# Patient Record
Sex: Male | Born: 1965 | Race: White | Hispanic: No | Marital: Married | Smoking: Never smoker
Health system: Southern US, Community
[De-identification: ages and names within clinical notes are randomized; demographics above are authoritative.]

## PROBLEM LIST (undated history)

## (undated) DIAGNOSIS — R55 Syncope and collapse: Secondary | ICD-10-CM

## (undated) DIAGNOSIS — I480 Paroxysmal atrial fibrillation: Secondary | ICD-10-CM

## (undated) DIAGNOSIS — E785 Hyperlipidemia, unspecified: Secondary | ICD-10-CM

## (undated) DIAGNOSIS — G4730 Sleep apnea, unspecified: Secondary | ICD-10-CM

## (undated) HISTORY — PX: APPENDECTOMY: SHX54

---

## 2009-02-27 ENCOUNTER — Ambulatory Visit: Payer: Self-pay | Admitting: Unknown Physician Specialty

## 2015-07-16 ENCOUNTER — Encounter: Payer: Self-pay | Admitting: *Deleted

## 2015-07-16 DIAGNOSIS — Z79899 Other long term (current) drug therapy: Secondary | ICD-10-CM | POA: Diagnosis not present

## 2015-07-16 DIAGNOSIS — Z8371 Family history of colonic polyps: Secondary | ICD-10-CM | POA: Diagnosis not present

## 2015-07-16 DIAGNOSIS — G4730 Sleep apnea, unspecified: Secondary | ICD-10-CM | POA: Diagnosis not present

## 2015-07-16 DIAGNOSIS — Z7982 Long term (current) use of aspirin: Secondary | ICD-10-CM | POA: Diagnosis not present

## 2015-07-16 DIAGNOSIS — D123 Benign neoplasm of transverse colon: Secondary | ICD-10-CM | POA: Diagnosis not present

## 2015-07-16 DIAGNOSIS — K640 First degree hemorrhoids: Secondary | ICD-10-CM | POA: Diagnosis not present

## 2015-07-16 DIAGNOSIS — Z1211 Encounter for screening for malignant neoplasm of colon: Secondary | ICD-10-CM | POA: Diagnosis present

## 2015-07-16 NOTE — Anesthesia Preprocedure Evaluation (Addendum)
Anesthesia Evaluation  Patient identified by MRN, date of birth, ID band Patient awake    Reviewed: Allergy & Precautions, H&P , NPO status , Patient's Chart, lab work & pertinent test results  History of Anesthesia Complications (+) PONV  Airway Mallampati: III  TM Distance: >3 FB Neck ROM: limited    Dental no notable dental hx. (+) Teeth Intact   Pulmonary sleep apnea and Continuous Positive Airway Pressure Ventilation ,  breath sounds clear to auscultation  Pulmonary exam normal       Cardiovascular negative cardio ROS Normal cardiovascular examRhythm:regular Rate:Normal     Neuro/Psych negative neurological ROS  negative psych ROS   GI/Hepatic negative GI ROS, Neg liver ROS,   Endo/Other  negative endocrine ROS  Renal/GU negative Renal ROS  negative genitourinary   Musculoskeletal   Abdominal Normal abdominal exam  (+)   Peds  Hematology negative hematology ROS (+)   Anesthesia Other Findings Past Medical History:   Sleep apnea                                                  Reproductive/Obstetrics negative OB ROS                           Anesthesia Physical Anesthesia Plan  ASA: III  Anesthesia Plan: General   Post-op Pain Management:    Induction: Intravenous  Airway Management Planned:   Additional Equipment:   Intra-op Plan:   Post-operative Plan:   Informed Consent: I have reviewed the patients History and Physical, chart, labs and discussed the procedure including the risks, benefits and alternatives for the proposed anesthesia with the patient or authorized representative who has indicated his/her understanding and acceptance.   Dental Advisory Given  Plan Discussed with: Anesthesiologist, CRNA and Surgeon  Anesthesia Plan Comments:        Anesthesia Quick Evaluation

## 2015-07-17 ENCOUNTER — Encounter: Payer: Self-pay | Admitting: Anesthesiology

## 2015-07-17 ENCOUNTER — Ambulatory Visit: Payer: BLUE CROSS/BLUE SHIELD | Admitting: Anesthesiology

## 2015-07-17 ENCOUNTER — Encounter
Admission: RE | Disposition: A | Discharge status: Home / Self Care | Payer: Self-pay | Source: Ambulatory Visit | Attending: Gastroenterology

## 2015-07-17 ENCOUNTER — Ambulatory Visit
Admission: RE | Admit: 2015-07-17 | Discharge: 2015-07-17 | Disposition: A | Discharge status: Home / Self Care | Payer: BLUE CROSS/BLUE SHIELD | Source: Ambulatory Visit | Attending: Gastroenterology | Admitting: Gastroenterology

## 2015-07-17 DIAGNOSIS — K640 First degree hemorrhoids: Secondary | ICD-10-CM | POA: Insufficient documentation

## 2015-07-17 DIAGNOSIS — Z8371 Family history of colonic polyps: Secondary | ICD-10-CM | POA: Insufficient documentation

## 2015-07-17 DIAGNOSIS — D123 Benign neoplasm of transverse colon: Secondary | ICD-10-CM | POA: Insufficient documentation

## 2015-07-17 DIAGNOSIS — Z7982 Long term (current) use of aspirin: Secondary | ICD-10-CM | POA: Insufficient documentation

## 2015-07-17 DIAGNOSIS — G4730 Sleep apnea, unspecified: Secondary | ICD-10-CM | POA: Insufficient documentation

## 2015-07-17 DIAGNOSIS — Z1211 Encounter for screening for malignant neoplasm of colon: Secondary | ICD-10-CM | POA: Insufficient documentation

## 2015-07-17 DIAGNOSIS — Z79899 Other long term (current) drug therapy: Secondary | ICD-10-CM | POA: Insufficient documentation

## 2015-07-17 HISTORY — DX: Sleep apnea, unspecified: G47.30

## 2015-07-17 HISTORY — PX: COLONOSCOPY WITH PROPOFOL: SHX5780

## 2015-07-17 SURGERY — COLONOSCOPY WITH PROPOFOL
Anesthesia: General

## 2015-07-17 MED ORDER — SODIUM CHLORIDE 0.9 % IV SOLN: INTRAVENOUS | Status: DC

## 2015-07-17 MED ORDER — PROPOFOL INFUSION 10 MG/ML OPTIME
INTRAVENOUS | Status: DC | PRN
  Administered 2015-07-17: 140 ug/kg/min via INTRAVENOUS

## 2015-07-17 MED ORDER — FENTANYL CITRATE (PF) 100 MCG/2ML IJ SOLN
INTRAMUSCULAR | Status: DC | PRN
  Administered 2015-07-17: 50 ug via INTRAVENOUS

## 2015-07-17 MED ORDER — MIDAZOLAM HCL 2 MG/2ML IJ SOLN
INTRAMUSCULAR | Status: DC | PRN
  Administered 2015-07-17: 1 mg via INTRAVENOUS

## 2015-07-17 MED ORDER — SODIUM CHLORIDE 0.9 % IV SOLN
INTRAVENOUS | Status: DC
  Administered 2015-07-17: 1000 mL via INTRAVENOUS

## 2015-07-17 NOTE — Anesthesia Postprocedure Evaluation (Signed)
  Anesthesia Post-op Note  Patient: Jesse Santiago  Procedure(s) Performed: Procedure(s): COLONOSCOPY WITH PROPOFOL (N/A)  Anesthesia type:General  Patient location: PACU  Post pain: Pain level controlled  Post assessment: Post-op Vital signs reviewed, Patient's Cardiovascular Status Stable, Respiratory Function Stable, Patent Airway and No signs of Nausea or vomiting  Post vital signs: Reviewed and stable  Last Vitals:  Filed Vitals:   07/17/15 1100  BP: 87/50  Pulse: 71  Temp:   Resp: 20    Level of consciousness: awake, alert  and patient cooperative  Complications: No apparent anesthesia complications

## 2015-07-17 NOTE — Op Note (Signed)
[facility] Patient Name: Jesse Santiago Procedure Date: 07/17/2015 10:06 AM MRN: [id number] Account #: [id number] Date of Birth: [birth year] Admit Type: Outpatient Age: 49 Room: [facility] ENDO ROOM 3 Gender: Male Note Status: Finalized Procedure:         Colonoscopy Indications:       Colon cancer screening in patient at increased risk:                     Family history of colon polyps, This is the patient's                     first colonoscopy Patient Profile:   This is a 49 year old male. Providers:         Gerrit Heck. Rayann Heman, MD Referring MD:      Eustace Quail. Hasmann (Referring MD) Medicines:         Propofol per Anesthesia Complications:     No immediate complications. Procedure:         Pre-Anesthesia Assessment:                    - Prior to the procedure, a History and Physical was                     performed, and patient medications, allergies and                     sensitivities were reviewed. The patient's tolerance of                     previous anesthesia was reviewed.                    After obtaining informed consent, the colonoscope was                     passed under direct vision. Throughout the procedure, the                     patient's blood pressure, pulse, and oxygen saturations                     were monitored continuously. The Colonoscope was                     introduced through the anus and advanced to the the cecum,                     identified by appendiceal orifice and ileocecal valve. The                     colonoscopy was performed without difficulty. The patient                     tolerated the procedure well. The quality of the bowel                     preparation was excellent. Findings:      The perianal and digital rectal examinations were normal.      A 3 mm polyp was found in the transverse colon. The polyp was sessile.       The polyp was removed with a cold snare. Resection and retrieval were   complete.      Internal hemorrhoids were found during retroflexion. The hemorrhoids  were Grade I (internal hemorrhoids that do not prolapse).      The exam was otherwise without abnormality. Impression:        - One 3 mm polyp in the transverse colon. Resected and                     retrieved.                    - Internal hemorrhoids.                    - The examination was otherwise normal. Recommendation:    - Observe patient in GI recovery unit.                    - Continue present medications.                    - Await pathology results.                    - Repeat colonoscopy in 5 years for surveillance /                     screening based on pathology results.                    - Return to referring physician.                    - The findings and recommendations were discussed with the                     patient.                    - The findings and recommendations were discussed with the                     patient's family. Procedure Code(s): --- Professional ---                    [id number], Colonoscopy, flexible; with removal of tumor(s),                     polyp(s), or other lesion(s) by snare technique CPT copyright 2014 [employer]. All rights reserved. The codes documented in this report are preliminary and upon coder review may  be revised to meet current compliance requirements. Mellody Life, MD 07/17/2015 10:51:11 AM This report has been signed electronically. Number of Addenda: 0 Note Initiated On: 07/17/2015 10:06 AM Scope Withdrawal Time: 0 hours 11 minutes 21 seconds  Total Procedure Duration: 0 hours 14 minutes 32 seconds       [facility]

## 2015-07-17 NOTE — H&P (Signed)
  Primary Care Physician:  Default, Provider, MD  Pre-Procedure History & Physical: HPI:  Jesse Santiago is a 49 y.o. male is here for an colonoscopy.   Past Medical History  Diagnosis Date  . Sleep apnea     Past Surgical History  Procedure Laterality Date  . Appendectomy      Prior to Admission medications   Medication Sig Start Date End Date Taking? Authorizing Provider  aspirin 81 MG tablet Take 81 mg by mouth daily.   Yes Historical Provider, MD  fluticasone (FLONASE) 50 MCG/ACT nasal spray Place into both nostrils daily.   Yes Historical Provider, MD  Multiple Vitamin (MULTIVITAMIN WITH MINERALS) TABS tablet Take 1 tablet by mouth daily.   Yes Historical Provider, MD  OMEGA-3 FATTY ACIDS PO Take by mouth.   Yes Historical Provider, MD    Allergies as of 06/30/2015  . (Not on File)    History reviewed. No pertinent family history.  Social History   Social History  . Marital Status: Married    Spouse Name: N/A  . Number of Children: N/A  . Years of Education: N/A   Occupational History  . Not on file.   Social History Main Topics  . Smoking status: Not on file  . Smokeless tobacco: Not on file  . Alcohol Use: Not on file  . Drug Use: Not on file  . Sexual Activity: Not on file   Other Topics Concern  . Not on file   Social History Narrative     Physical Exam: BP 124/81 mmHg  Pulse 79  Temp(Src) 97.9 F (36.6 C) (Tympanic)  Resp 20  Ht 5\' 10"  (1.778 m)  Wt 96.163 kg (212 lb)  BMI 30.42 kg/m2  SpO2 97% General:   Alert,  pleasant and cooperative in NAD Head:  Normocephalic and atraumatic. Neck:  Supple; no masses or thyromegaly. Lungs:  Clear throughout to auscultation.    Heart:  Regular rate and rhythm. Abdomen:  Soft, nontender and nondistended. Normal bowel sounds, without guarding, and without rebound.   Neurologic:  Alert and  oriented x4;  grossly normal neurologically.  Impression/Plan: Jesse Santiago is here for an colonoscopy to be  performed for screening  Risks, benefits, limitations, and alternatives regarding  colonoscopy have been reviewed with the patient.  Questions have been answered.  All parties agreeable.   Josefine Class, MD  07/17/2015, 10:22 AM

## 2015-07-17 NOTE — Discharge Instructions (Signed)

## 2015-07-17 NOTE — Transfer of Care (Signed)
Immediate Anesthesia Transfer of Care Note  Patient: Jesse Santiago  Procedure(s) Performed: Procedure(s): COLONOSCOPY WITH PROPOFOL (N/A)  Patient Location: PACU and Endoscopy Unit  Anesthesia Type:General  Level of Consciousness: alert   Airway & Oxygen Therapy: Patient Spontanous Breathing  Post-op Assessment: Report given to RN  Post vital signs: Reviewed  Last Vitals:  Filed Vitals:   07/17/15 1100  BP: 87/50  Pulse: 71  Temp:   Resp: 20    Complications: No apparent anesthesia complications

## 2015-07-18 NOTE — Progress Notes (Signed)
Non-identifying voicemail.  No message left.

## 2015-07-20 ENCOUNTER — Encounter: Payer: Self-pay | Admitting: Gastroenterology

## 2015-07-20 LAB — SURGICAL PATHOLOGY

## 2016-05-02 DIAGNOSIS — E785 Hyperlipidemia, unspecified: Secondary | ICD-10-CM | POA: Insufficient documentation

## 2016-05-02 DIAGNOSIS — G4733 Obstructive sleep apnea (adult) (pediatric): Secondary | ICD-10-CM | POA: Insufficient documentation

## 2018-01-28 DIAGNOSIS — I4891 Unspecified atrial fibrillation: Secondary | ICD-10-CM | POA: Insufficient documentation

## 2018-12-27 ENCOUNTER — Emergency Department: Payer: BLUE CROSS/BLUE SHIELD

## 2018-12-27 ENCOUNTER — Other Ambulatory Visit: Payer: Self-pay

## 2018-12-27 ENCOUNTER — Observation Stay
Admission: EM | Admit: 2018-12-27 | Discharge: 2018-12-28 | Disposition: A | Discharge status: Home / Self Care | Payer: BLUE CROSS/BLUE SHIELD | Attending: Internal Medicine | Admitting: Internal Medicine

## 2018-12-27 DIAGNOSIS — I480 Paroxysmal atrial fibrillation: Secondary | ICD-10-CM | POA: Diagnosis not present

## 2018-12-27 DIAGNOSIS — E669 Obesity, unspecified: Secondary | ICD-10-CM | POA: Insufficient documentation

## 2018-12-27 DIAGNOSIS — G4733 Obstructive sleep apnea (adult) (pediatric): Secondary | ICD-10-CM | POA: Insufficient documentation

## 2018-12-27 DIAGNOSIS — R42 Dizziness and giddiness: Secondary | ICD-10-CM | POA: Diagnosis present

## 2018-12-27 DIAGNOSIS — R55 Syncope and collapse: Secondary | ICD-10-CM | POA: Diagnosis present

## 2018-12-27 DIAGNOSIS — I4891 Unspecified atrial fibrillation: Secondary | ICD-10-CM | POA: Diagnosis present

## 2018-12-27 DIAGNOSIS — E785 Hyperlipidemia, unspecified: Secondary | ICD-10-CM | POA: Diagnosis not present

## 2018-12-27 DIAGNOSIS — Z7982 Long term (current) use of aspirin: Secondary | ICD-10-CM | POA: Insufficient documentation

## 2018-12-27 DIAGNOSIS — G4730 Sleep apnea, unspecified: Secondary | ICD-10-CM | POA: Diagnosis present

## 2018-12-27 HISTORY — DX: Syncope and collapse: R55

## 2018-12-27 HISTORY — DX: Hyperlipidemia, unspecified: E78.5

## 2018-12-27 HISTORY — DX: Paroxysmal atrial fibrillation: I48.0

## 2018-12-27 NOTE — ED Triage Notes (Signed)
Pt arrived via [facility] from home with c/o near syncope, lightheaded, and dizzy. EMS states that last year pt had a similar episode and after time was taken off his metoprolol due to no findings of afib. EMS states that pt was in afib at arrival but normal on arrival.

## 2018-12-28 ENCOUNTER — Encounter: Payer: Self-pay | Admitting: Hospitalist

## 2018-12-28 ENCOUNTER — Emergency Department: Payer: BLUE CROSS/BLUE SHIELD

## 2018-12-28 ENCOUNTER — Observation Stay: Payer: BLUE CROSS/BLUE SHIELD

## 2018-12-28 ENCOUNTER — Other Ambulatory Visit: Payer: Self-pay

## 2018-12-28 ENCOUNTER — Observation Stay
Admit: 2018-12-28 | Discharge: 2018-12-28 | Disposition: A | Discharge status: Home / Self Care | Payer: BLUE CROSS/BLUE SHIELD | Attending: Family Medicine | Admitting: Family Medicine

## 2018-12-28 DIAGNOSIS — R55 Syncope and collapse: Secondary | ICD-10-CM | POA: Diagnosis present

## 2018-12-28 DIAGNOSIS — G4730 Sleep apnea, unspecified: Secondary | ICD-10-CM | POA: Diagnosis present

## 2018-12-28 DIAGNOSIS — R42 Dizziness and giddiness: Secondary | ICD-10-CM

## 2018-12-28 DIAGNOSIS — I480 Paroxysmal atrial fibrillation: Secondary | ICD-10-CM | POA: Diagnosis present

## 2018-12-28 LAB — BASIC METABOLIC PANEL
Anion gap: 6 (ref 5–15)
BUN: 20 mg/dL (ref 6–20)
CHLORIDE: 104 mmol/L (ref 98–111)
CO2: 30 mmol/L (ref 22–32)
Calcium: 9.3 mg/dL (ref 8.9–10.3)
Creatinine, Ser: 1.04 mg/dL (ref 0.61–1.24)
GFR calc non Af Amer: >60 mL/min (ref >60)
Glucose, Bld: 124 mg/dL — ABNORMAL HIGH (ref 70–99)
POTASSIUM: 3.6 mmol/L (ref 3.5–5.1)
SODIUM: 140 mmol/L (ref 135–145)

## 2018-12-28 LAB — LIPID PANEL
Cholesterol: 110 mg/dL (ref 0–200)
HDL: 38 mg/dL — ABNORMAL LOW (ref >40)
LDL Cholesterol: 49 mg/dL (ref 0–99)
Total CHOL/HDL Ratio: 2.9 RATIO
Triglycerides: 113 mg/dL (ref <150)
VLDL: 23 mg/dL (ref 0–40)

## 2018-12-28 LAB — TROPONIN I
Troponin I: <0.03 ng/mL (ref <0.03)
Troponin I: <0.03 ng/mL (ref <0.03)

## 2018-12-28 LAB — ECHOCARDIOGRAM COMPLETE
Height: 70 in
Weight: 3414.4 oz

## 2018-12-28 LAB — CBC
HCT: 43.9 % (ref 39.0–52.0)
HEMOGLOBIN: 15 g/dL (ref 13.0–17.0)
MCH: 32.3 pg (ref 26.0–34.0)
MCHC: 34.2 g/dL (ref 30.0–36.0)
MCV: 94.4 fL (ref 80.0–100.0)
NRBC: 0 % (ref 0.0–0.2)
PLATELETS: 224 10*3/uL (ref 150–400)
RBC: 4.65 MIL/uL (ref 4.22–5.81)
RDW: 12.7 % (ref 11.5–15.5)
WBC: 9 10*3/uL (ref 4.0–10.5)

## 2018-12-28 LAB — TSH: TSH: 3.554 u[IU]/mL (ref 0.350–4.500)

## 2018-12-28 MED ORDER — GADOBUTROL 1 MMOL/ML IV SOLN
9.0000 mL | Freq: Once | INTRAVENOUS | Status: AC | PRN
  Administered 2018-12-28: 9 mL via INTRAVENOUS

## 2018-12-28 MED ORDER — ONDANSETRON HCL 4 MG/2ML IJ SOLN: 4.0000 mg | Freq: Four times a day (QID) | INTRAMUSCULAR | Status: DC | PRN

## 2018-12-28 MED ORDER — ACETAMINOPHEN 650 MG RE SUPP: 650.0000 mg | Freq: Four times a day (QID) | RECTAL | Status: DC | PRN

## 2018-12-28 MED ORDER — HYDROCODONE-ACETAMINOPHEN 5-325 MG PO TABS: 1.0000 | ORAL_TABLET | ORAL | Status: DC | PRN

## 2018-12-28 MED ORDER — FLUTICASONE PROPIONATE 50 MCG/ACT NA SUSP
1.0000 | NASAL | Status: DC | PRN
  Filled 2018-12-28: qty 16

## 2018-12-28 MED ORDER — ACETAMINOPHEN 325 MG PO TABS: 650.0000 mg | ORAL_TABLET | Freq: Four times a day (QID) | ORAL | Status: DC | PRN

## 2018-12-28 MED ORDER — ENOXAPARIN SODIUM 40 MG/0.4ML ~~LOC~~ SOLN
40.0000 mg | SUBCUTANEOUS | Status: DC
  Administered 2018-12-28: 40 mg via SUBCUTANEOUS
  Filled 2018-12-28: qty 0.4

## 2018-12-28 MED ORDER — POLYETHYLENE GLYCOL 3350 17 G PO PACK: 17.0000 g | PACK | Freq: Every day | ORAL | Status: DC | PRN

## 2018-12-28 MED ORDER — SODIUM CHLORIDE 0.9% FLUSH
3.0000 mL | Freq: Two times a day (BID) | INTRAVENOUS | Status: DC
  Administered 2018-12-28: 3 mL via INTRAVENOUS

## 2018-12-28 MED ORDER — ASPIRIN EC 81 MG PO TBEC: 81.0000 mg | DELAYED_RELEASE_TABLET | Freq: Every day | ORAL | Status: DC

## 2018-12-28 MED ORDER — ONDANSETRON HCL 4 MG PO TABS: 4.0000 mg | ORAL_TABLET | Freq: Four times a day (QID) | ORAL | Status: DC | PRN

## 2018-12-28 NOTE — Progress Notes (Signed)
*  PRELIMINARY RESULTS* Echocardiogram 2D Echocardiogram has been performed.  Jesse Santiago 12/28/2018, 11:27 AM

## 2018-12-28 NOTE — ED Notes (Signed)
Patient transported to CT 

## 2018-12-28 NOTE — ED Notes (Signed)
Patient transported to MRI 

## 2018-12-28 NOTE — Progress Notes (Signed)
Family Meeting Note  Advance Directive:yes  Today a meeting took place with the Patient, spouse.  Patient is able to participate  The following clinical team members were present during this meeting:MD  The following were discussed:Patient's diagnosis: Near syncope, obstructive sleep apnea, paroxysmal A. fib, hyperlipidemia, Patient's progosis: Unable to determine and Goals for treatment: Full Code  Additional follow-up to be provided: prn  Time spent during discussion:20 minutes  Gorden Harms, MD

## 2018-12-28 NOTE — H&P (Signed)
[facility] [place] NAME: Jesse Santiago    MR#:  [id number]  DATE OF BIRTH:  1966-06-15  DATE OF ADMISSION:  12/27/2018  PRIMARY CARE PHYSICIAN: Katheren Shams   REQUESTING/REFERRING PHYSICIAN:   CHIEF COMPLAINT:   Chief Complaint  Patient presents with  . Near Syncope    HISTORY OF PRESENT ILLNESS: Jesse Santiago  is a 53 y.o. male with a known history per below presenting with acute dizziness, lightheadedness, near syncope after episode of eruptations earlier today, similar episode last year which required short stay at [facility] in which stress test was normal/echocardiogram normal, patient denies feeling ill, no chest pain/shortness of breath,+ sweating per wife, in the emergency room patient underwent extensive work-up that included CT head/MRI/MRA of the brain which was unimpressive, laboratory investigations unimpressive, EKG noted for rate controlled A. fib, patient evaluated in the emergency room, no apparent distress, resting comfortably in bed, wife at the bedside, patient is now being admitted for acute dizziness/lightheadedness/near syncope after eruptations suspicious for recurrent vasovagal phenomena.  PAST MEDICAL HISTORY:   Past Medical History:  Diagnosis Date  . Sleep apnea   Paroxysmal A. Fib  PAST SURGICAL HISTORY:  Past Surgical History:  Procedure Laterality Date  . APPENDECTOMY    . COLONOSCOPY WITH PROPOFOL N/A 07/17/2015   Procedure: COLONOSCOPY WITH PROPOFOL;  Surgeon: Josefine Class, MD;  Location: [facility] ENDOSCOPY;  Service: Endoscopy;  Laterality: N/A;    SOCIAL HISTORY:  Social History   Tobacco Use  . Smoking status: Never Smoker  . Smokeless tobacco: Former Systems developer    Types: Chew  Substance Use Topics  . Alcohol use: Not on file    FAMILY HISTORY:  Hypertension  DRUG ALLERGIES: No Known Allergies  REVIEW OF SYSTEMS:   CONSTITUTIONAL: No fever, fatigue or weakness.  EYES: No blurred or  double vision.  EARS, NOSE, AND THROAT: No tinnitus or ear pain.  RESPIRATORY: No cough, shortness of breath, wheezing or hemoptysis.  CARDIOVASCULAR: No chest pain, orthopnea, edema.  GASTROINTESTINAL: No nausea, vomiting, diarrhea or abdominal pain.  GENITOURINARY: No dysuria, hematuria.  ENDOCRINE: No polyuria, nocturia,  HEMATOLOGY: No anemia, easy bruising or bleeding SKIN: No rash or lesion. MUSCULOSKELETAL: No joint pain or arthritis.   NEUROLOGIC: No tingling, numbness, weakness. + Lightheadedness, near fainting, dizziness PSYCHIATRY: No anxiety or depression.   MEDICATIONS AT HOME:  Prior to Admission medications   Medication Sig Start Date End Date Taking? Authorizing Provider  aspirin 81 MG tablet Take 81 mg by mouth daily.    [provider]  fluticasone (FLONASE) 50 MCG/ACT nasal spray Place into both nostrils daily.    [provider]  Multiple Vitamin (MULTIVITAMIN WITH MINERALS) TABS tablet Take 1 tablet by mouth daily.    [provider]  OMEGA-3 FATTY ACIDS PO Take by mouth.    [provider]      PHYSICAL EXAMINATION:   VITAL SIGNS: Blood pressure 105/68, pulse 92, temperature 97.9 F (36.6 C), temperature source Oral, resp. rate 18, height 5\' 10"  (1.778 m), weight 98 kg, SpO2 97 %.  GENERAL:  53 y.o.-year-old patient lying in the bed with no acute distress.  Obese, nontoxic-appearing EYES: Pupils equal, round, reactive to light and accommodation. No scleral icterus. Extraocular muscles intact.  HEENT: Head atraumatic, normocephalic. Oropharynx and nasopharynx clear.  NECK:  Supple, no jugular venous distention. No thyroid enlargement, no tenderness.  LUNGS: Normal breath sounds bilaterally, no wheezing, rales,rhonchi or crepitation. No use  of accessory muscles of respiration.  CARDIOVASCULAR: S1, S2 normal. No murmurs, rubs, or gallops.  ABDOMEN: Soft, nontender, nondistended. Bowel sounds present. No organomegaly or mass.   EXTREMITIES: No pedal edema, cyanosis, or clubbing.  NEUROLOGIC: Cranial nerves II through XII are intact. MAES Gait not checked.  PSYCHIATRIC: The patient is alert and oriented x 3.  SKIN: No obvious rash, lesion, or ulcer.   LABORATORY PANEL:   CBC Recent Labs  Lab 12/27/18 2300  WBC 9.0  HGB 15.0  HCT 43.9  PLT 224  MCV 94.4  MCH 32.3  MCHC 34.2  RDW 12.7   ------------------------------------------------------------------------------------------------------------------  Chemistries  Recent Labs  Lab 12/27/18 2300  NA 140  K 3.6  CL 104  CO2 30  GLUCOSE 124*  BUN 20  CREATININE 1.04  CALCIUM 9.3   ------------------------------------------------------------------------------------------------------------------ estimated creatinine clearance is 96.4 mL/min (by C-G formula based on SCr of 1.04 mg/dL). ------------------------------------------------------------------------------------------------------------------ No results for input(s): TSH, T4TOTAL, T3FREE, THYROIDAB in the last 72 hours.  Invalid input(s): FREET3   Coagulation profile No results for input(s): INR, PROTIME in the last 168 hours. ------------------------------------------------------------------------------------------------------------------- No results for input(s): DDIMER in the last 72 hours. -------------------------------------------------------------------------------------------------------------------  Cardiac Enzymes Recent Labs  Lab 12/27/18 2300 12/28/18 0213  TROPONINI <0.03 <0.03   ------------------------------------------------------------------------------------------------------------------ Invalid input(s): POCBNP  ---------------------------------------------------------------------------------------------------------------  Urinalysis No results found for: COLORURINE, APPEARANCEUR, LABSPEC, PHURINE, GLUCOSEU, HGBUR, BILIRUBINUR, KETONESUR, PROTEINUR,  UROBILINOGEN, NITRITE, LEUKOCYTESUR   RADIOLOGY: Ct Head Wo Contrast  Result Date: 12/28/2018 CLINICAL DATA:  Near syncopal episode with lightheadedness and dizziness. EXAM: CT HEAD WITHOUT CONTRAST TECHNIQUE: Contiguous axial images were obtained from the base of the skull through the vertex without intravenous contrast. COMPARISON:  None. FINDINGS: BRAIN: The ventricles and sulci are normal. No intraparenchymal hemorrhage, mass effect nor midline shift. No acute large vascular territory infarcts. Grey-white matter distinction is maintained. The basal ganglia are unremarkable. No abnormal extra-axial fluid collections. Basal cisterns are not effaced and midline. The brainstem and cerebellar hemispheres are without acute abnormalities. VASCULAR: Unremarkable. SKULL/SOFT TISSUES: No skull fracture. No significant soft tissue swelling. ORBITS/SINUSES: The included ocular globes and orbital contents are normal.The mastoid air cells are clear. The included paranasal sinuses are well-aerated. OTHER: Mild scalp edema or soft tissue swelling near the vertex. IMPRESSION: No acute intracranial abnormality. Electronically Signed   By: Ashley Royalty M.D.   On: 12/28/2018 00:34   Mr Virgel Paling AL Contrast  Result Date: 12/28/2018 CLINICAL DATA:  53 y/o  M; near syncope, lightheadedness, dizziness. EXAM: MRI HEAD WITHOUT CONTRAST MRA HEAD WITHOUT CONTRAST MRA NECK WITHOUT AND WITH CONTRAST TECHNIQUE: Multiplanar, multiecho pulse sequences of the brain and surrounding structures were obtained without intravenous contrast. Angiographic images of the Circle of Willis were obtained using MRA technique without intravenous contrast. Angiographic images of the neck were obtained using MRA technique without and with intravenous contrast. Carotid stenosis measurements (when applicable) are obtained utilizing NASCET criteria, using the distal internal carotid diameter as the denominator. CONTRAST:  9 cc Gadavist COMPARISON:   12/28/2018 CT head FINDINGS: MRI HEAD FINDINGS Brain: No acute infarction, hemorrhage, hydrocephalus, extra-axial collection or mass lesion. Vascular: Normal flow voids. Skull and upper cervical spine: Normal marrow signal. Sinuses/Orbits: Mild maxillary sinus mucosal thickening. No abnormal signal of the additional visible paranasal sinuses or the mastoid air cells. Orbits are unremarkable. Other: None. MRA HEAD FINDINGS Internal carotid arteries:  Patent. Anterior cerebral arteries:  Patent. Middle cerebral arteries: Patent. Anterior communicating artery: Patent. Posterior communicating arteries: Not identified, likely  hypoplastic or absent. Posterior cerebral arteries:  Patent. Basilar artery:  Patent. Vertebral arteries:  Patent. No evidence of high-grade stenosis, large vessel occlusion, or aneurysm. MRA NECK FINDINGS Aortic arch: Patent. Right common carotid artery: Patent. Right internal carotid artery: Patent. Right vertebral artery: Patent. Left common carotid artery: Patent. Left Internal carotid artery: Patent. Left Vertebral artery: Patent. There is no evidence of hemodynamically significant stenosis by NASCET criteria, occlusion, or aneurysm. IMPRESSION: 1. No acute intracranial abnormality identified. Unremarkable MRI of the head. 2. Normal MRA of the head. 3. Normal MRA of the neck. Electronically Signed   By: Kristine Garbe M.D.   On: 12/28/2018 02:51   Mr Angiogram Neck W Or Wo Contrast  Result Date: 12/28/2018 CLINICAL DATA:  53 y/o  M; near syncope, lightheadedness, dizziness. EXAM: MRI HEAD WITHOUT CONTRAST MRA HEAD WITHOUT CONTRAST MRA NECK WITHOUT AND WITH CONTRAST TECHNIQUE: Multiplanar, multiecho pulse sequences of the brain and surrounding structures were obtained without intravenous contrast. Angiographic images of the Circle of Willis were obtained using MRA technique without intravenous contrast. Angiographic images of the neck were obtained using MRA technique without and  with intravenous contrast. Carotid stenosis measurements (when applicable) are obtained utilizing NASCET criteria, using the distal internal carotid diameter as the denominator. CONTRAST:  9 cc Gadavist COMPARISON:  12/28/2018 CT head FINDINGS: MRI HEAD FINDINGS Brain: No acute infarction, hemorrhage, hydrocephalus, extra-axial collection or mass lesion. Vascular: Normal flow voids. Skull and upper cervical spine: Normal marrow signal. Sinuses/Orbits: Mild maxillary sinus mucosal thickening. No abnormal signal of the additional visible paranasal sinuses or the mastoid air cells. Orbits are unremarkable. Other: None. MRA HEAD FINDINGS Internal carotid arteries:  Patent. Anterior cerebral arteries:  Patent. Middle cerebral arteries: Patent. Anterior communicating artery: Patent. Posterior communicating arteries: Not identified, likely hypoplastic or absent. Posterior cerebral arteries:  Patent. Basilar artery:  Patent. Vertebral arteries:  Patent. No evidence of high-grade stenosis, large vessel occlusion, or aneurysm. MRA NECK FINDINGS Aortic arch: Patent. Right common carotid artery: Patent. Right internal carotid artery: Patent. Right vertebral artery: Patent. Left common carotid artery: Patent. Left Internal carotid artery: Patent. Left Vertebral artery: Patent. There is no evidence of hemodynamically significant stenosis by NASCET criteria, occlusion, or aneurysm. IMPRESSION: 1. No acute intracranial abnormality identified. Unremarkable MRI of the head. 2. Normal MRA of the head. 3. Normal MRA of the neck. Electronically Signed   By: Kristine Garbe M.D.   On: 12/28/2018 02:51   Mr Brain Wo Contrast  Result Date: 12/28/2018 CLINICAL DATA:  53 y/o  M; near syncope, lightheadedness, dizziness. EXAM: MRI HEAD WITHOUT CONTRAST MRA HEAD WITHOUT CONTRAST MRA NECK WITHOUT AND WITH CONTRAST TECHNIQUE: Multiplanar, multiecho pulse sequences of the brain and surrounding structures were obtained without  intravenous contrast. Angiographic images of the Circle of Willis were obtained using MRA technique without intravenous contrast. Angiographic images of the neck were obtained using MRA technique without and with intravenous contrast. Carotid stenosis measurements (when applicable) are obtained utilizing NASCET criteria, using the distal internal carotid diameter as the denominator. CONTRAST:  9 cc Gadavist COMPARISON:  12/28/2018 CT head FINDINGS: MRI HEAD FINDINGS Brain: No acute infarction, hemorrhage, hydrocephalus, extra-axial collection or mass lesion. Vascular: Normal flow voids. Skull and upper cervical spine: Normal marrow signal. Sinuses/Orbits: Mild maxillary sinus mucosal thickening. No abnormal signal of the additional visible paranasal sinuses or the mastoid air cells. Orbits are unremarkable. Other: None. MRA HEAD FINDINGS Internal carotid arteries:  Patent. Anterior cerebral arteries:  Patent. Middle cerebral arteries: Patent. Anterior  communicating artery: Patent. Posterior communicating arteries: Not identified, likely hypoplastic or absent. Posterior cerebral arteries:  Patent. Basilar artery:  Patent. Vertebral arteries:  Patent. No evidence of high-grade stenosis, large vessel occlusion, or aneurysm. MRA NECK FINDINGS Aortic arch: Patent. Right common carotid artery: Patent. Right internal carotid artery: Patent. Right vertebral artery: Patent. Left common carotid artery: Patent. Left Internal carotid artery: Patent. Left Vertebral artery: Patent. There is no evidence of hemodynamically significant stenosis by NASCET criteria, occlusion, or aneurysm. IMPRESSION: 1. No acute intracranial abnormality identified. Unremarkable MRI of the head. 2. Normal MRA of the head. 3. Normal MRA of the neck. Electronically Signed   By: Kristine Garbe M.D.   On: 12/28/2018 02:51   Dg Chest Port 1 View  Result Date: 12/28/2018 CLINICAL DATA:  Near syncope, lightheaded, and dizzy. EXAM: PORTABLE  CHEST 1 VIEW COMPARISON:  None. FINDINGS: Cardiac enlargement. No vascular congestion, edema, or consolidation. No blunting of costophrenic angles. No pneumothorax. Mediastinal contours appear intact. IMPRESSION: Cardiac enlargement. No evidence of active pulmonary disease. Electronically Signed   By: Lucienne Capers M.D.   On: 12/28/2018 00:31    EKG: Orders placed or performed during the hospital encounter of 12/27/18  . ED EKG  . ED EKG  . EKG 12-Lead  . EKG 12-Lead  . EKG 12-Lead  . EKG 12-Lead    IMPRESSION AND PLAN: *Acute recurrent dizziness, near syncope, lightheadedness Occurring after eruptations, noted history of paroxysmal A. fib-on aspirin Suspect secondary to vasovagal phenomena Referred to the observation unit on telemetry, cycle cardiac enzymes though ACS highly doubted-[facility] stress testing March 2019 was a negative study/echo at that time normal as well, check orthostatics every shift, repeat echocardiogram, check carotid Dopplers, gentle IV fluids for rehydration, cardiology to see given symptom complex/noted paroxysmal A. fib, neurochecks per routine, fall precautions, and continue close medical monitoring  *Chronic paroxysmal A. Fib Chads vas 2 score 0, rate controlled Continue aspirin, currently not on AV nodal blocking agents, and all other plans as stated above  *History of obstructive sleep apnea CPAP at bedtime/as needed  *Chronic hyperlipidemia, unspecified Check lipids in the morning, continue omega-3 fatty acids  *Obesity Most likely secondary to excess calories Lifestyle modification recommended  Consider disposition Home later today barring any complications with cardiology clearance and after work-up completion  All the records are reviewed and case discussed with ED provider. Management plans discussed with the patient, family and they are in agreement.  CODE STATUS:full    TOTAL TIME TAKING CARE OF THIS PATIENT: 45 minutes.    Avel Peace   M.D on 12/28/2018   Between 7am to 6pm - Pager - [phone]  After 6pm go to [email or web address] - password [email or web address] [facility]  Office  [phone]  CC: Primary care physician; Katheren Shams   Note: This dictation was prepared with Dragon dictation along with smaller phrase technology. Any transcriptional errors that result from this process are unintentional.

## 2018-12-28 NOTE — Progress Notes (Signed)
Went over discharge instructions with the patient and wife including follow-up appointment and how to care for orthostatic hypotension, they verbalized understanding. Discontinue peripheral IV and telemetry monitor. RN help patient for transport.

## 2018-12-28 NOTE — Progress Notes (Signed)
Dr. Manuella Ghazi notified of positive orthostatics with lightheadedness on standing. No new orders. Patient educated on safety and agreed to use bed alarm while symptomatic.

## 2018-12-28 NOTE — ED Provider Notes (Signed)
[facility] Emergency Department Provider Note _   First MD Initiated Contact with Patient 12/28/18 0300     (approximate)  I have reviewed the triage vital signs and the nursing notes.   HISTORY  Chief Complaint Near Syncope    HPI Latonya Gappa is a 53 y.o. male with history of atrial fibrillation and previous syncopal episode March 2019 presents to the emergency department via EMS with acute onset of lightheadedness and dizziness while at home tonight.  Patient states that symptoms have since resolved.  Patient denies any chest pain no shortness of breath nausea vomiting diarrhea or constipation.  Patient denies any lower extremity pain or swelling.  Patient denies any change in vision.  Patient not taking anticoagulation.  Patient does admit to occipital headache and posterior neck pain at the time of dizziness as well.  Past Medical History:  Diagnosis Date  . Sleep apnea     There are no active problems to display for this patient.   Past Surgical History:  Procedure Laterality Date  . APPENDECTOMY    . COLONOSCOPY WITH PROPOFOL N/A 07/17/2015   Procedure: COLONOSCOPY WITH PROPOFOL;  Surgeon: Josefine Class, MD;  Location: [facility] ENDOSCOPY;  Service: Endoscopy;  Laterality: N/A;    Prior to Admission medications   Medication Sig Start Date End Date Taking? Authorizing Provider  aspirin 81 MG tablet Take 81 mg by mouth daily.    [provider]  fluticasone (FLONASE) 50 MCG/ACT nasal spray Place into both nostrils daily.    [provider]  Multiple Vitamin (MULTIVITAMIN WITH MINERALS) TABS tablet Take 1 tablet by mouth daily.    [provider]  OMEGA-3 FATTY ACIDS PO Take by mouth.    [provider]    Allergies Patient has no known allergies.  History reviewed. No pertinent family history.  Social History Social History   Tobacco Use  . Smoking status: Never Smoker  . Smokeless tobacco: Former  Systems developer    Types: Chew  Substance Use Topics  . Alcohol use: Not on file  . Drug use: Not on file    Review of Systems Constitutional: No fever/chills Eyes: No visual changes. ENT: No sore throat. Cardiovascular: Denies chest pain. Respiratory: Denies shortness of breath. Gastrointestinal: No abdominal pain.  No nausea, no vomiting.  No diarrhea.  No constipation. Genitourinary: Negative for dysuria. Musculoskeletal: Negative for neck pain.  Negative for back pain. Integumentary: Negative for rash. Neurological: Negative for headaches, focal weakness or numbness.  Positive for lightheadedness and dizziness (resolved)   ____________________________________________   PHYSICAL EXAM:  VITAL SIGNS: ED Triage Vitals  Enc Vitals Group     BP 12/27/18 2300 111/77     Pulse Rate 12/27/18 2300 76     Resp 12/27/18 2300 (!) 9     Temp 12/27/18 2301 97.9 F (36.6 C)     Temp Source 12/27/18 2301 Oral     SpO2 12/27/18 2300 97 %     Weight 12/27/18 2303 98 kg (216 lb)     Height 12/27/18 2303 1.778 m (5\' 10" )     Head Circumference --      Peak Flow --      Pain Score 12/27/18 2303 0     Pain Loc --      Pain Edu? --      Excl. in [place]? --     Constitutional: Alert and oriented. Well appearing and in no acute distress. Eyes: Conjunctivae are normal.  Mouth/Throat: Mucous membranes are moist. Oropharynx non-erythematous. Neck: No stridor.  Cardiovascular: Normal rate, regular rhythm. Good peripheral circulation. Grossly normal heart sounds. Respiratory: Normal respiratory effort.  No retractions. Lungs CTAB. Gastrointestinal: Soft and nontender. No distention.  Musculoskeletal: No lower extremity tenderness nor edema. No gross deformities of extremities. Neurologic:  Normal speech and language. No gross focal neurologic deficits are appreciated.  Skin:  Skin is warm, dry and intact. No rash noted. Psychiatric: Mood and affect are normal. Speech and behavior are  normal.  ____________________________________________   LABS (all labs ordered are listed, but only abnormal results are displayed)  Labs Reviewed  BASIC METABOLIC PANEL - Abnormal; Notable for the following components:      Result Value   Glucose, Bld 124 (*)    All other components within normal limits  CBC  TROPONIN I  TROPONIN I   ____________________________________________  EKG  ED ECG REPORT I, [place] Arisha Gervais, the attending physician, personally viewed and interpreted this ECG.   Date: 12/27/2018  EKG Time: 1:49 PM  Rate: 90  Rhythm: Atrial fibrillation  Axis: Normal  Intervals: Irregular RR interval  ST&T Change: None  ____________________________________________  RADIOLOGY I, [place] Sheresa Cullop, personally viewed and evaluated these images (plain radiographs) as part of my medical decision making, as well as reviewing the written report by the radiologist.  ED MD interpretation: CT head revealed no acute intracranial abnormality.  MRI MRA of the head and neck performed which was negative per radiologist.  Official radiology report(s): Ct Head Wo Contrast  Result Date: 12/28/2018 CLINICAL DATA:  Near syncopal episode with lightheadedness and dizziness. EXAM: CT HEAD WITHOUT CONTRAST TECHNIQUE: Contiguous axial images were obtained from the base of the skull through the vertex without intravenous contrast. COMPARISON:  None. FINDINGS: BRAIN: The ventricles and sulci are normal. No intraparenchymal hemorrhage, mass effect nor midline shift. No acute large vascular territory infarcts. Grey-white matter distinction is maintained. The basal ganglia are unremarkable. No abnormal extra-axial fluid collections. Basal cisterns are not effaced and midline. The brainstem and cerebellar hemispheres are without acute abnormalities. VASCULAR: Unremarkable. SKULL/SOFT TISSUES: No skull fracture. No significant soft tissue swelling. ORBITS/SINUSES: The included ocular globes and  orbital contents are normal.The mastoid air cells are clear. The included paranasal sinuses are well-aerated. OTHER: Mild scalp edema or soft tissue swelling near the vertex. IMPRESSION: No acute intracranial abnormality. Electronically Signed   By: Ashley Royalty M.D.   On: 12/28/2018 00:34   Mr Virgel Paling UJ Contrast  Result Date: 12/28/2018 CLINICAL DATA:  53 y/o  M; near syncope, lightheadedness, dizziness. EXAM: MRI HEAD WITHOUT CONTRAST MRA HEAD WITHOUT CONTRAST MRA NECK WITHOUT AND WITH CONTRAST TECHNIQUE: Multiplanar, multiecho pulse sequences of the brain and surrounding structures were obtained without intravenous contrast. Angiographic images of the Circle of Willis were obtained using MRA technique without intravenous contrast. Angiographic images of the neck were obtained using MRA technique without and with intravenous contrast. Carotid stenosis measurements (when applicable) are obtained utilizing NASCET criteria, using the distal internal carotid diameter as the denominator. CONTRAST:  9 cc Gadavist COMPARISON:  12/28/2018 CT head FINDINGS: MRI HEAD FINDINGS Brain: No acute infarction, hemorrhage, hydrocephalus, extra-axial collection or mass lesion. Vascular: Normal flow voids. Skull and upper cervical spine: Normal marrow signal. Sinuses/Orbits: Mild maxillary sinus mucosal thickening. No abnormal signal of the additional visible paranasal sinuses or the mastoid air cells. Orbits are unremarkable. Other: None. MRA HEAD FINDINGS Internal carotid arteries:  Patent. Anterior cerebral arteries:  Patent. Middle cerebral  arteries: Patent. Anterior communicating artery: Patent. Posterior communicating arteries: Not identified, likely hypoplastic or absent. Posterior cerebral arteries:  Patent. Basilar artery:  Patent. Vertebral arteries:  Patent. No evidence of high-grade stenosis, large vessel occlusion, or aneurysm. MRA NECK FINDINGS Aortic arch: Patent. Right common carotid artery: Patent. Right  internal carotid artery: Patent. Right vertebral artery: Patent. Left common carotid artery: Patent. Left Internal carotid artery: Patent. Left Vertebral artery: Patent. There is no evidence of hemodynamically significant stenosis by NASCET criteria, occlusion, or aneurysm. IMPRESSION: 1. No acute intracranial abnormality identified. Unremarkable MRI of the head. 2. Normal MRA of the head. 3. Normal MRA of the neck. Electronically Signed   By: Kristine Garbe M.D.   On: 12/28/2018 02:51   Mr Angiogram Neck W Or Wo Contrast  Result Date: 12/28/2018 CLINICAL DATA:  53 y/o  M; near syncope, lightheadedness, dizziness. EXAM: MRI HEAD WITHOUT CONTRAST MRA HEAD WITHOUT CONTRAST MRA NECK WITHOUT AND WITH CONTRAST TECHNIQUE: Multiplanar, multiecho pulse sequences of the brain and surrounding structures were obtained without intravenous contrast. Angiographic images of the Circle of Willis were obtained using MRA technique without intravenous contrast. Angiographic images of the neck were obtained using MRA technique without and with intravenous contrast. Carotid stenosis measurements (when applicable) are obtained utilizing NASCET criteria, using the distal internal carotid diameter as the denominator. CONTRAST:  9 cc Gadavist COMPARISON:  12/28/2018 CT head FINDINGS: MRI HEAD FINDINGS Brain: No acute infarction, hemorrhage, hydrocephalus, extra-axial collection or mass lesion. Vascular: Normal flow voids. Skull and upper cervical spine: Normal marrow signal. Sinuses/Orbits: Mild maxillary sinus mucosal thickening. No abnormal signal of the additional visible paranasal sinuses or the mastoid air cells. Orbits are unremarkable. Other: None. MRA HEAD FINDINGS Internal carotid arteries:  Patent. Anterior cerebral arteries:  Patent. Middle cerebral arteries: Patent. Anterior communicating artery: Patent. Posterior communicating arteries: Not identified, likely hypoplastic or absent. Posterior cerebral arteries:   Patent. Basilar artery:  Patent. Vertebral arteries:  Patent. No evidence of high-grade stenosis, large vessel occlusion, or aneurysm. MRA NECK FINDINGS Aortic arch: Patent. Right common carotid artery: Patent. Right internal carotid artery: Patent. Right vertebral artery: Patent. Left common carotid artery: Patent. Left Internal carotid artery: Patent. Left Vertebral artery: Patent. There is no evidence of hemodynamically significant stenosis by NASCET criteria, occlusion, or aneurysm. IMPRESSION: 1. No acute intracranial abnormality identified. Unremarkable MRI of the head. 2. Normal MRA of the head. 3. Normal MRA of the neck. Electronically Signed   By: Kristine Garbe M.D.   On: 12/28/2018 02:51   Mr Brain Wo Contrast  Result Date: 12/28/2018 CLINICAL DATA:  53 y/o  M; near syncope, lightheadedness, dizziness. EXAM: MRI HEAD WITHOUT CONTRAST MRA HEAD WITHOUT CONTRAST MRA NECK WITHOUT AND WITH CONTRAST TECHNIQUE: Multiplanar, multiecho pulse sequences of the brain and surrounding structures were obtained without intravenous contrast. Angiographic images of the Circle of Willis were obtained using MRA technique without intravenous contrast. Angiographic images of the neck were obtained using MRA technique without and with intravenous contrast. Carotid stenosis measurements (when applicable) are obtained utilizing NASCET criteria, using the distal internal carotid diameter as the denominator. CONTRAST:  9 cc Gadavist COMPARISON:  12/28/2018 CT head FINDINGS: MRI HEAD FINDINGS Brain: No acute infarction, hemorrhage, hydrocephalus, extra-axial collection or mass lesion. Vascular: Normal flow voids. Skull and upper cervical spine: Normal marrow signal. Sinuses/Orbits: Mild maxillary sinus mucosal thickening. No abnormal signal of the additional visible paranasal sinuses or the mastoid air cells. Orbits are unremarkable. Other: None. MRA HEAD FINDINGS Internal carotid arteries:  Patent. Anterior cerebral  arteries:  Patent. Middle cerebral arteries: Patent. Anterior communicating artery: Patent. Posterior communicating arteries: Not identified, likely hypoplastic or absent. Posterior cerebral arteries:  Patent. Basilar artery:  Patent. Vertebral arteries:  Patent. No evidence of high-grade stenosis, large vessel occlusion, or aneurysm. MRA NECK FINDINGS Aortic arch: Patent. Right common carotid artery: Patent. Right internal carotid artery: Patent. Right vertebral artery: Patent. Left common carotid artery: Patent. Left Internal carotid artery: Patent. Left Vertebral artery: Patent. There is no evidence of hemodynamically significant stenosis by NASCET criteria, occlusion, or aneurysm. IMPRESSION: 1. No acute intracranial abnormality identified. Unremarkable MRI of the head. 2. Normal MRA of the head. 3. Normal MRA of the neck. Electronically Signed   By: Kristine Garbe M.D.   On: 12/28/2018 02:51   Dg Chest Port 1 View  Result Date: 12/28/2018 CLINICAL DATA:  Near syncope, lightheaded, and dizzy. EXAM: PORTABLE CHEST 1 VIEW COMPARISON:  None. FINDINGS: Cardiac enlargement. No vascular congestion, edema, or consolidation. No blunting of costophrenic angles. No pneumothorax. Mediastinal contours appear intact. IMPRESSION: Cardiac enlargement. No evidence of active pulmonary disease. Electronically Signed   By: Lucienne Capers M.D.   On: 12/28/2018 00:31     Procedures   ____________________________________________   INITIAL IMPRESSION / ASSESSMENT AND PLAN / ED COURSE  As part of my medical decision making, I reviewed the following data within the electronic MEDICAL RECORD NUMBER  53 year old male presenting with above-stated history and physical exam secondary to dizziness with noted atrial fibrillation on arrival to the emergency department.  Differential diagnosis include atrial fibrillation with rapid ventricular spots which may have resolved before arrival to the emergency department, CVA,  vasovagal event.  EKG revealed evidence of atrial fibrillation however rate controlled.  Laboratory data unremarkable including troponin x2.  MRI MRA of the brain and neck performed which was negative.  Patient discussed with Dr. Jerelyn Charles for hospital admission for further evaluation and management for near syncope and atrial fibrillation.    ____________________________________________  FINAL CLINICAL IMPRESSION(S) / ED DIAGNOSES  Final diagnoses:  Atrial fibrillation, unspecified type ([place])  Dizziness     MEDICATIONS GIVEN DURING THIS VISIT:  Medications  gadobutrol (GADAVIST) 1 MMOL/ML injection 9 mL (9 mLs Intravenous Contrast Given 12/28/18 0226)     ED Discharge Orders    None       Note:  This document was prepared using Dragon voice recognition software and may include unintentional dictation errors.    Gregor Hams, MD 12/28/18 [phone]

## 2018-12-28 NOTE — Plan of Care (Signed)

## 2018-12-28 NOTE — Discharge Summary (Signed)
[facility] [place] NAME: Jesse Santiago    MR#:  [id number]  DATE OF BIRTH:  21-May-1966  DATE OF ADMISSION:  12/27/2018   ADMITTING PHYSICIAN: Gorden Harms, MD  DATE OF DISCHARGE: 12/28/2018  PRIMARY CARE PHYSICIAN: [facility]   ADMISSION DIAGNOSIS:  Dizziness [R42] Near syncope [R55] Atrial fibrillation, unspecified type ([place]) [I48.91] DISCHARGE DIAGNOSIS:  Active Problems:   Paroxysmal atrial fibrillation ([place])   Sleep apnea  SECONDARY DIAGNOSIS:   Past Medical History:  Diagnosis Date  . Hyperlipidemia   . Paroxysmal atrial fibrillation (H[facility])   . Sleep apnea   . Syncope    HOSPITAL COURSE:   Jesse Santiago is a 53 year old male with a history of paroxysmal atrial fibrillation, hyperlipidemia, sleep apnea, and syncope, who presented to the ED yesterday after an episode of near syncope after eating initiating with feelings of "gas and burping". Endorsed acute dizziness, lightheadedness, near syncope, similar to an episode last year which required short stay at [facility] in which stress test was normal/echocardiogram normal. Patient denies feeling ill, no chest pain or shortness of breath, and associated with diaphoresis and nausea.  In the emergency room patient underwent extensive work-up that included CT head/MRI/MRA of the brain which was unremarkable and US carotid was unremarkable. Laboratory investigations including serial troponins within normal limits, EKG noted for rate controlled A. Fib.   Patient evaluated in the emergency room, no apparent distress, resting comfortably in bed, and wife at the bedside. Patient admitted for acute dizziness/lightheadedness/near-syncope suspicious for recurrent vasovagal phenomena with cardiac and neurological etiology rule out.  Plan:   *Acute recurrent dizziness, near syncope, lightheadedness Suspect secondary to vasovagal phenomena, although patient also has positive  orthostatic hypotension on vitals on 12/28/2018 with associated lightheadedness upon sitting to standing. Referred to the observation unit on telemetry, cardiac enzymes negative - [facility] stress testing March 2019 was a negative study/Echo at that time normal as well - repeat echocardiogram: non-concerning per Cardiology - cardiology c/s given symptom complexity/noted paroxysmal A. Fib, recommendations: OP cardiology follow-up, defer restarting AV nodal blocking agent - neuro-checks conducted per routine, fall precautions, and continued close medical monitoring  *Chronic paroxysmal A. Fib Chads vas 2 score 0, rate controlled Continue aspirin, currently not on AV nodal blocking agents, and all other plans as stated above - per cardiology will defer on starting rate control agent pending OP cardiology follow-up  *History of obstructive sleep apnea CPAP at bedtime/as needed Sleep study planned for the future per patient and wife, to be scheduled after meeting with PCP  *Chronic hyperlipidemia, unspecified - lipid panel 12/28/18: LDL 49 Chol 110  - continue atorvastatin 40 mg daily - patient reported he does not take fish oil since starting statin   *Obesity Lifestyle modification recommended  DISCHARGE CONDITIONS:  stable CONSULTS OBTAINED:  Treatment Team:  Isaias Cowman, MD DRUG ALLERGIES:  No Known Allergies DISCHARGE MEDICATIONS:   Allergies as of 12/28/2018   No Known Allergies     Medication List    TAKE these medications   aspirin 81 MG tablet Take 81 mg by mouth daily.   atorvastatin 40 MG tablet Commonly known as:  LIPITOR TAKE 1 TABLET BY MOUTH EVERY DAY   cyclobenzaprine 10 MG tablet Commonly known as:  FLEXERIL Take 1 tablet by mouth at bedtime as needed.   fluticasone 50 MCG/ACT nasal spray Commonly known as:  FLONASE Place 1 spray into both nostrils daily as needed.  multivitamin with minerals Tabs tablet Take 1 tablet by mouth daily.   OMEGA-3  FATTY ACIDS PO Take by mouth.        DISCHARGE INSTRUCTIONS:   DIET:  Regular diet DISCHARGE CONDITION:  Stable ACTIVITY:  Activity as tolerated OXYGEN:  Home Oxygen: No.  Oxygen Delivery: room air DISCHARGE LOCATION:  home   If you experience worsening of your admission symptoms, develop shortness of breath, life threatening emergency, suicidal or homicidal thoughts you must seek medical attention immediately by calling 911 or calling your MD immediately if your symptoms are severe.  You Must read complete instructions/literature along with all the possible adverse reactions/side effects for all the medicines you take and that have been prescribed to you. Take any new medicines only after you have completely understood and accept all the possible adverse reactions/side effects.   Please note  You were cared for by a hospitalist during your hospital stay. If you have any questions about your discharge medications or the care you received while you were in the hospital after you are discharged, you can call the unit and asked to speak with the hospitalist on call if the hospitalist that took care of you is not available. Once you are discharged, your primary care physician will handle any further medical issues. Please note that NO REFILLS for any discharge medications will be authorized once you are discharged, as it is imperative that you return to your primary care physician (or establish a relationship with a primary care physician if you do not have one) for your aftercare needs so that they can reassess your need for medications and monitor your lab values.   On the day of Discharge:  VITAL SIGNS:  Blood pressure 104/69, pulse 78, temperature 97.6 F (36.4 C), temperature source Oral, resp. rate 19, height 5\' 10"  (1.778 m), weight 96.8 kg, SpO2 98 %.   Orthostatic Blood Pressure: 12/28/18 9:56 A Blood pressure:   lying 106/82, sitting 106/81, standing 77/51 Pulse:   lying  70, sitting 77, standing 81  PHYSICAL EXAMINATION:  GENERAL:  53 y.o.-year-old patient lying in the bed with no acute distress.  EYES: Pupils equal, round, reactive to light and accommodation. No scleral icterus. Extraocular muscles intact.  HEENT: Head atraumatic, normocephalic. Oropharynx and nasopharynx clear.  NECK:  Supple, no jugular venous distention. No thyroid enlargement, no tenderness.  LUNGS: Normal breath sounds bilaterally, no wheezing, rales,rhonchi or crepitation. No use of accessory muscles of respiration.  CARDIOVASCULAR: No murmurs, rubs, or gallops. Regular rate. Irregular rhythm. ABDOMEN: Soft, non-tender, non-distended. Bowel sounds present. No organomegaly or mass.  EXTREMITIES: No pedal edema, cyanosis, or clubbing.  NEUROLOGIC: Cranial nerves II through XII are intact. Muscle strength 5/5 in all extremities. Sensation intact. Gait not checked.  PSYCHIATRIC: The patient is alert and oriented x 3.  SKIN: No obvious rash, lesion, or ulcer.  DATA REVIEW:   CBC Recent Labs  Lab 12/27/18 2300  WBC 9.0  HGB 15.0  HCT 43.9  PLT 224    Chemistries  Recent Labs  Lab 12/27/18 2300  NA 140  K 3.6  CL 104  CO2 30  GLUCOSE 124*  BUN 20  CREATININE 1.04  CALCIUM 9.3    Ref Range & Units 06:48 04:58 02:13 1d ago  Troponin I <0.03 ng/mL <0.03  <0.03 CM <0.03 CM <0.03 CM   TSH 0.350 - 4.500 uIU/mL 3.554    Lipid Panel     Component Value Date/Time   CHOL 110 12/28/2018  1323   TRIG 113 12/28/2018 1323   HDL 38 (L) 12/28/2018 1323   CHOLHDL 2.9 12/28/2018 1323   VLDL 23 12/28/2018 1323   LDLCALC 49 12/28/2018 1323    RADIOLOGY:  Ct Head Wo Contrast  Result Date: 12/28/2018 CLINICAL DATA:  Near syncopal episode with lightheadedness and dizziness. EXAM: CT HEAD WITHOUT CONTRAST TECHNIQUE: Contiguous axial images were obtained from the base of the skull through the vertex without intravenous contrast. COMPARISON:  None. FINDINGS: BRAIN: The ventricles and  sulci are normal. No intraparenchymal hemorrhage, mass effect nor midline shift. No acute large vascular territory infarcts. Grey-white matter distinction is maintained. The basal ganglia are unremarkable. No abnormal extra-axial fluid collections. Basal cisterns are not effaced and midline. The brainstem and cerebellar hemispheres are without acute abnormalities. VASCULAR: Unremarkable. SKULL/SOFT TISSUES: No skull fracture. No significant soft tissue swelling. ORBITS/SINUSES: The included ocular globes and orbital contents are normal.The mastoid air cells are clear. The included paranasal sinuses are well-aerated. OTHER: Mild scalp edema or soft tissue swelling near the vertex. IMPRESSION: No acute intracranial abnormality. Electronically Signed   By: Ashley Royalty M.D.   On: 12/28/2018 00:34   Mr Virgel Paling ZO Contrast  Result Date: 12/28/2018 CLINICAL DATA:  53 y/o  M; near syncope, lightheadedness, dizziness. EXAM: MRI HEAD WITHOUT CONTRAST MRA HEAD WITHOUT CONTRAST MRA NECK WITHOUT AND WITH CONTRAST TECHNIQUE: Multiplanar, multiecho pulse sequences of the brain and surrounding structures were obtained without intravenous contrast. Angiographic images of the Circle of Willis were obtained using MRA technique without intravenous contrast. Angiographic images of the neck were obtained using MRA technique without and with intravenous contrast. Carotid stenosis measurements (when applicable) are obtained utilizing NASCET criteria, using the distal internal carotid diameter as the denominator. CONTRAST:  9 cc Gadavist COMPARISON:  12/28/2018 CT head FINDINGS: MRI HEAD FINDINGS Brain: No acute infarction, hemorrhage, hydrocephalus, extra-axial collection or mass lesion. Vascular: Normal flow voids. Skull and upper cervical spine: Normal marrow signal. Sinuses/Orbits: Mild maxillary sinus mucosal thickening. No abnormal signal of the additional visible paranasal sinuses or the mastoid air cells. Orbits are  unremarkable. Other: None. MRA HEAD FINDINGS Internal carotid arteries:  Patent. Anterior cerebral arteries:  Patent. Middle cerebral arteries: Patent. Anterior communicating artery: Patent. Posterior communicating arteries: Not identified, likely hypoplastic or absent. Posterior cerebral arteries:  Patent. Basilar artery:  Patent. Vertebral arteries:  Patent. No evidence of high-grade stenosis, large vessel occlusion, or aneurysm. MRA NECK FINDINGS Aortic arch: Patent. Right common carotid artery: Patent. Right internal carotid artery: Patent. Right vertebral artery: Patent. Left common carotid artery: Patent. Left Internal carotid artery: Patent. Left Vertebral artery: Patent. There is no evidence of hemodynamically significant stenosis by NASCET criteria, occlusion, or aneurysm. IMPRESSION: 1. No acute intracranial abnormality identified. Unremarkable MRI of the head. 2. Normal MRA of the head. 3. Normal MRA of the neck. Electronically Signed   By: Kristine Garbe M.D.   On: 12/28/2018 02:51   Mr Angiogram Neck W Or Wo Contrast  Result Date: 12/28/2018 CLINICAL DATA:  53 y/o  M; near syncope, lightheadedness, dizziness. EXAM: MRI HEAD WITHOUT CONTRAST MRA HEAD WITHOUT CONTRAST MRA NECK WITHOUT AND WITH CONTRAST TECHNIQUE: Multiplanar, multiecho pulse sequences of the brain and surrounding structures were obtained without intravenous contrast. Angiographic images of the Circle of Willis were obtained using MRA technique without intravenous contrast. Angiographic images of the neck were obtained using MRA technique without and with intravenous contrast. Carotid stenosis measurements (when applicable) are obtained utilizing NASCET criteria, using the distal  internal carotid diameter as the denominator. CONTRAST:  9 cc Gadavist COMPARISON:  12/28/2018 CT head FINDINGS: MRI HEAD FINDINGS Brain: No acute infarction, hemorrhage, hydrocephalus, extra-axial collection or mass lesion. Vascular: Normal flow  voids. Skull and upper cervical spine: Normal marrow signal. Sinuses/Orbits: Mild maxillary sinus mucosal thickening. No abnormal signal of the additional visible paranasal sinuses or the mastoid air cells. Orbits are unremarkable. Other: None. MRA HEAD FINDINGS Internal carotid arteries:  Patent. Anterior cerebral arteries:  Patent. Middle cerebral arteries: Patent. Anterior communicating artery: Patent. Posterior communicating arteries: Not identified, likely hypoplastic or absent. Posterior cerebral arteries:  Patent. Basilar artery:  Patent. Vertebral arteries:  Patent. No evidence of high-grade stenosis, large vessel occlusion, or aneurysm. MRA NECK FINDINGS Aortic arch: Patent. Right common carotid artery: Patent. Right internal carotid artery: Patent. Right vertebral artery: Patent. Left common carotid artery: Patent. Left Internal carotid artery: Patent. Left Vertebral artery: Patent. There is no evidence of hemodynamically significant stenosis by NASCET criteria, occlusion, or aneurysm. IMPRESSION: 1. No acute intracranial abnormality identified. Unremarkable MRI of the head. 2. Normal MRA of the head. 3. Normal MRA of the neck. Electronically Signed   By: Kristine Garbe M.D.   On: 12/28/2018 02:51   Mr Brain Wo Contrast  Result Date: 12/28/2018 CLINICAL DATA:  53 y/o  M; near syncope, lightheadedness, dizziness. EXAM: MRI HEAD WITHOUT CONTRAST MRA HEAD WITHOUT CONTRAST MRA NECK WITHOUT AND WITH CONTRAST TECHNIQUE: Multiplanar, multiecho pulse sequences of the brain and surrounding structures were obtained without intravenous contrast. Angiographic images of the Circle of Willis were obtained using MRA technique without intravenous contrast. Angiographic images of the neck were obtained using MRA technique without and with intravenous contrast. Carotid stenosis measurements (when applicable) are obtained utilizing NASCET criteria, using the distal internal carotid diameter as the denominator.  CONTRAST:  9 cc Gadavist COMPARISON:  12/28/2018 CT head FINDINGS: MRI HEAD FINDINGS Brain: No acute infarction, hemorrhage, hydrocephalus, extra-axial collection or mass lesion. Vascular: Normal flow voids. Skull and upper cervical spine: Normal marrow signal. Sinuses/Orbits: Mild maxillary sinus mucosal thickening. No abnormal signal of the additional visible paranasal sinuses or the mastoid air cells. Orbits are unremarkable. Other: None. MRA HEAD FINDINGS Internal carotid arteries:  Patent. Anterior cerebral arteries:  Patent. Middle cerebral arteries: Patent. Anterior communicating artery: Patent. Posterior communicating arteries: Not identified, likely hypoplastic or absent. Posterior cerebral arteries:  Patent. Basilar artery:  Patent. Vertebral arteries:  Patent. No evidence of high-grade stenosis, large vessel occlusion, or aneurysm. MRA NECK FINDINGS Aortic arch: Patent. Right common carotid artery: Patent. Right internal carotid artery: Patent. Right vertebral artery: Patent. Left common carotid artery: Patent. Left Internal carotid artery: Patent. Left Vertebral artery: Patent. There is no evidence of hemodynamically significant stenosis by NASCET criteria, occlusion, or aneurysm. IMPRESSION: 1. No acute intracranial abnormality identified. Unremarkable MRI of the head. 2. Normal MRA of the head. 3. Normal MRA of the neck. Electronically Signed   By: Kristine Garbe M.D.   On: 12/28/2018 02:51   US Carotid Bilateral  Result Date: 12/28/2018 CLINICAL DATA:  Near syncope EXAM: BILATERAL CAROTID DUPLEX ULTRASOUND TECHNIQUE: Pearline Cables scale imaging, color Doppler and duplex ultrasound were performed of bilateral carotid and vertebral arteries in the neck. COMPARISON:  None. FINDINGS: Criteria: Quantification of carotid stenosis is based on velocity parameters that correlate the residual internal carotid diameter with NASCET-based stenosis levels, using the diameter of the distal internal carotid  lumen as the denominator for stenosis measurement. The following velocity measurements were obtained: RIGHT ICA: 66  cm/sec CCA: 95 cm/sec SYSTOLIC ICA/CCA RATIO:  0.7 ECA: 120 cm/sec LEFT ICA: 68 cm/sec CCA: 89 cm/sec SYSTOLIC ICA/CCA RATIO:  0.8 ECA: 114 cm/sec RIGHT CAROTID ARTERY: Little if any plaque in the bulb. It has low resistance internal carotid Doppler pattern with sharp upstroke. RIGHT VERTEBRAL ARTERY:  Antegrade. LEFT CAROTID ARTERY: Minimal smooth soft plaque in the bulb. Low resistance internal carotid Doppler pattern is preserved. LEFT VERTEBRAL ARTERY:  Antegrade. IMPRESSION: Less than 50% stenosis in the right and left internal carotid arteries. Electronically Signed   By: Marybelle Killings M.D.   On: 12/28/2018 09:38   Dg Chest Port 1 View  Result Date: 12/28/2018 CLINICAL DATA:  Near syncope, lightheaded, and dizzy. EXAM: PORTABLE CHEST 1 VIEW COMPARISON:  None. FINDINGS: Cardiac enlargement. No vascular congestion, edema, or consolidation. No blunting of costophrenic angles. No pneumothorax. Mediastinal contours appear intact. IMPRESSION: Cardiac enlargement. No evidence of active pulmonary disease. Electronically Signed   By: Lucienne Capers M.D.   On: 12/28/2018 00:31    Management plans discussed with the patient and/or family and they are in agreement.  CODE STATUS: Full Code   TOTAL TIME TAKING CARE OF THIS PATIENT:  45 minutes.   Ripley Fraise PA-C on 12/28/2018 at 4:34 PM  Between 7am to 6pm - Pager - [phone]  After 6pm go to [email or web address] - [facility]  Office  [phone]  CC: Primary care physician; Katheren Shams

## 2018-12-28 NOTE — Discharge Instructions (Signed)
As discussed, please call to make an appointment with your PCP Dr. Caryl Comes to be seen by 2/7.  As discussed, please call to make an appointment with [facility] by 2/14. At that time the doctor will decide if he will start a medication to control your heart rate/for management of your atrial fibrillation.  Continue taking your home medications.

## 2018-12-28 NOTE — Consult Note (Signed)
[facility] Cardiology  CARDIOLOGY CONSULT NOTE  Patient ID: Jesse Santiago MRN: [id number] DOB/AGE: 1966/01/31 53 y.o.  Admit date: 12/27/2018 Referring Physician: Dr. Manuella Ghazi Primary Physician: Dr. Caryl Comes Primary Cardiologist: Dr. Dossie Der [facility])  Reason for Consultation: Atrial fibrillation   HPI:  Mr. Silvernail is a 53 year old male with a history of paroxysmal atrial fibrillation, hyperlipidemia, sleep apnea, and syncope, who presented to the ED yesterday after an episode of near syncope. Following a usual dinner of spaghetti, he started to feel "burpy". He burped several times and then abruptly became lightheaded and diaphoretic as he was about to sit on the toilet. He called his wife, who assisted him to the ground. He did not lose consciousness. EMS was called. He was reportedly in sinus rhythm on the portable monitor, however he was found to be in atrial fibrillation once on the monitors in the ER. He denies having any chest pain during the pre-syncopal episode or since his admission. He is completely asymptomatic at this time.    He was admitted to [facility] approximately one year ago for similar episode of nausea and lightheadedness, found to have new onset atrial fibrillation. He underwent cardiac workup with PET Stress and ECHO at that time. Mild coronary calcification noted in LAD, however no evidence of ischemia was noted. Normal EF. He was initially started on 50 mg of metroprolol, however he had a syncopal episode with 10 second pause, so metoprolol was decreased to 25 mg prior to discharge. After hospital discharge, he stopped metoprolol altogether. He did not receive anticoagulation given CHADsVAS2 score of 0. Has been on aspirin 81 mg alone.   Telemetry review shows patient in rate controlled atrial fibrillation. ER work up with normal CXR. Negative troponin. Normal TSH. Normal CBC.   Review of systems complete and found to be negative unless listed above   Past Medical History:  Diagnosis Date   . Sleep apnea     Past Surgical History:  Procedure Laterality Date  . APPENDECTOMY    . COLONOSCOPY WITH PROPOFOL N/A 07/17/2015   Procedure: COLONOSCOPY WITH PROPOFOL;  Surgeon: Josefine Class, MD;  Location: [facility] ENDOSCOPY;  Service: Endoscopy;  Laterality: N/A;    Medications Prior to Admission  Medication Sig Dispense Refill Last Dose  . aspirin 81 MG tablet Take 81 mg by mouth daily.   12/27/2018 at Unknown time  . atorvastatin (LIPITOR) 40 MG tablet TAKE 1 TABLET BY MOUTH EVERY DAY   12/27/2018 at Unknown time  . cyclobenzaprine (FLEXERIL) 10 MG tablet Take 1 tablet by mouth at bedtime as needed.   prn at prn  . fluticasone (FLONASE) 50 MCG/ACT nasal spray Place 1 spray into both nostrils daily as needed.    prn at prn  . Multiple Vitamin (MULTIVITAMIN WITH MINERALS) TABS tablet Take 1 tablet by mouth daily.   12/27/2018 at Unknown time  . OMEGA-3 FATTY ACIDS PO Take by mouth.   Not Taking at Unknown time   Social History   Socioeconomic History  . Marital status: Married    Spouse name: Not on file  . Number of children: Not on file  . Years of education: Not on file  . Highest education level: Not on file  Occupational History  . Not on file  Social Needs  . Financial resource strain: Not on file  . Food insecurity:    Worry: Not on file    Inability: Not on file  . Transportation needs:    Medical: Not on file  Non-medical: Not on file  Tobacco Use  . Smoking status: Never Smoker  . Smokeless tobacco: Former Systems developer    Types: Chew  Substance and Sexual Activity  . Alcohol use: Not on file  . Drug use: Not on file  . Sexual activity: Not on file  Lifestyle  . Physical activity:    Days per week: Not on file    Minutes per session: Not on file  . Stress: Not on file  Relationships  . Social connections:    Talks on phone: Not on file    Gets together: Not on file    Attends religious service: Not on file    Active member of club or organization: Not on  file    Attends meetings of clubs or organizations: Not on file    Relationship status: Not on file  . Intimate partner violence:    Fear of current or ex partner: Not on file    Emotionally abused: Not on file    Physically abused: Not on file    Forced sexual activity: Not on file  Other Topics Concern  . Not on file  Social History Narrative  . Not on file    History reviewed. No pertinent family history.    Review of systems complete and found to be negative unless listed above    PHYSICAL EXAM  General: Well developed, well nourished, in no acute distress HEENT: Pupils equal, round, and reactive to light bilaterally  Neck:  No JVD.  Lungs: Clear bilaterally to auscultation and percussion. Heart: HRRR . Normal S1 and S2 without gallops or murmurs.  Abdomen: Bowel sounds are positive, abdomen soft and non-tender  Msk:  Back normal, normal gait. Normal strength and tone for age. Extremities: No clubbing, cyanosis or edema.   Neuro: Alert and oriented X 3. Psych:  Good affect, responds appropriately  Labs:   Lab Results  Component Value Date   WBC 9.0 12/27/2018   HGB 15.0 12/27/2018   HCT 43.9 12/27/2018   MCV 94.4 12/27/2018   PLT 224 12/27/2018    Recent Labs  Lab 12/27/18 2300  NA 140  K 3.6  CL 104  CO2 30  BUN 20  CREATININE 1.04  CALCIUM 9.3  GLUCOSE 124*   Lab Results  Component Value Date   TROPONINI <0.03 12/28/2018   No results found for: CHOL No results found for: HDL No results found for: LDLCALC No results found for: TRIG No results found for: CHOLHDL No results found for: LDLDIRECT    Radiology: Ct Head Wo Contrast  Result Date: 12/28/2018 CLINICAL DATA:  Near syncopal episode with lightheadedness and dizziness. EXAM: CT HEAD WITHOUT CONTRAST TECHNIQUE: Contiguous axial images were obtained from the base of the skull through the vertex without intravenous contrast. COMPARISON:  None. FINDINGS: BRAIN: The ventricles and sulci are  normal. No intraparenchymal hemorrhage, mass effect nor midline shift. No acute large vascular territory infarcts. Grey-white matter distinction is maintained. The basal ganglia are unremarkable. No abnormal extra-axial fluid collections. Basal cisterns are not effaced and midline. The brainstem and cerebellar hemispheres are without acute abnormalities. VASCULAR: Unremarkable. SKULL/SOFT TISSUES: No skull fracture. No significant soft tissue swelling. ORBITS/SINUSES: The included ocular globes and orbital contents are normal.The mastoid air cells are clear. The included paranasal sinuses are well-aerated. OTHER: Mild scalp edema or soft tissue swelling near the vertex. IMPRESSION: No acute intracranial abnormality. Electronically Signed   By: Ashley Royalty M.D.   On: 12/28/2018 00:34  Mr Virgel Paling RD Contrast  Result Date: 12/28/2018 CLINICAL DATA:  53 y/o  M; near syncope, lightheadedness, dizziness. EXAM: MRI HEAD WITHOUT CONTRAST MRA HEAD WITHOUT CONTRAST MRA NECK WITHOUT AND WITH CONTRAST TECHNIQUE: Multiplanar, multiecho pulse sequences of the brain and surrounding structures were obtained without intravenous contrast. Angiographic images of the Circle of Willis were obtained using MRA technique without intravenous contrast. Angiographic images of the neck were obtained using MRA technique without and with intravenous contrast. Carotid stenosis measurements (when applicable) are obtained utilizing NASCET criteria, using the distal internal carotid diameter as the denominator. CONTRAST:  9 cc Gadavist COMPARISON:  12/28/2018 CT head FINDINGS: MRI HEAD FINDINGS Brain: No acute infarction, hemorrhage, hydrocephalus, extra-axial collection or mass lesion. Vascular: Normal flow voids. Skull and upper cervical spine: Normal marrow signal. Sinuses/Orbits: Mild maxillary sinus mucosal thickening. No abnormal signal of the additional visible paranasal sinuses or the mastoid air cells. Orbits are unremarkable. Other:  None. MRA HEAD FINDINGS Internal carotid arteries:  Patent. Anterior cerebral arteries:  Patent. Middle cerebral arteries: Patent. Anterior communicating artery: Patent. Posterior communicating arteries: Not identified, likely hypoplastic or absent. Posterior cerebral arteries:  Patent. Basilar artery:  Patent. Vertebral arteries:  Patent. No evidence of high-grade stenosis, large vessel occlusion, or aneurysm. MRA NECK FINDINGS Aortic arch: Patent. Right common carotid artery: Patent. Right internal carotid artery: Patent. Right vertebral artery: Patent. Left common carotid artery: Patent. Left Internal carotid artery: Patent. Left Vertebral artery: Patent. There is no evidence of hemodynamically significant stenosis by NASCET criteria, occlusion, or aneurysm. IMPRESSION: 1. No acute intracranial abnormality identified. Unremarkable MRI of the head. 2. Normal MRA of the head. 3. Normal MRA of the neck. Electronically Signed   By: Kristine Garbe M.D.   On: 12/28/2018 02:51   Mr Angiogram Neck W Or Wo Contrast  Result Date: 12/28/2018 CLINICAL DATA:  53 y/o  M; near syncope, lightheadedness, dizziness. EXAM: MRI HEAD WITHOUT CONTRAST MRA HEAD WITHOUT CONTRAST MRA NECK WITHOUT AND WITH CONTRAST TECHNIQUE: Multiplanar, multiecho pulse sequences of the brain and surrounding structures were obtained without intravenous contrast. Angiographic images of the Circle of Willis were obtained using MRA technique without intravenous contrast. Angiographic images of the neck were obtained using MRA technique without and with intravenous contrast. Carotid stenosis measurements (when applicable) are obtained utilizing NASCET criteria, using the distal internal carotid diameter as the denominator. CONTRAST:  9 cc Gadavist COMPARISON:  12/28/2018 CT head FINDINGS: MRI HEAD FINDINGS Brain: No acute infarction, hemorrhage, hydrocephalus, extra-axial collection or mass lesion. Vascular: Normal flow voids. Skull and upper  cervical spine: Normal marrow signal. Sinuses/Orbits: Mild maxillary sinus mucosal thickening. No abnormal signal of the additional visible paranasal sinuses or the mastoid air cells. Orbits are unremarkable. Other: None. MRA HEAD FINDINGS Internal carotid arteries:  Patent. Anterior cerebral arteries:  Patent. Middle cerebral arteries: Patent. Anterior communicating artery: Patent. Posterior communicating arteries: Not identified, likely hypoplastic or absent. Posterior cerebral arteries:  Patent. Basilar artery:  Patent. Vertebral arteries:  Patent. No evidence of high-grade stenosis, large vessel occlusion, or aneurysm. MRA NECK FINDINGS Aortic arch: Patent. Right common carotid artery: Patent. Right internal carotid artery: Patent. Right vertebral artery: Patent. Left common carotid artery: Patent. Left Internal carotid artery: Patent. Left Vertebral artery: Patent. There is no evidence of hemodynamically significant stenosis by NASCET criteria, occlusion, or aneurysm. IMPRESSION: 1. No acute intracranial abnormality identified. Unremarkable MRI of the head. 2. Normal MRA of the head. 3. Normal MRA of the neck. Electronically Signed   By: Mia Creek  [place]-Stratton M.D.   On: 12/28/2018 02:51   Mr Brain Wo Contrast  Result Date: 12/28/2018 CLINICAL DATA:  53 y/o  M; near syncope, lightheadedness, dizziness. EXAM: MRI HEAD WITHOUT CONTRAST MRA HEAD WITHOUT CONTRAST MRA NECK WITHOUT AND WITH CONTRAST TECHNIQUE: Multiplanar, multiecho pulse sequences of the brain and surrounding structures were obtained without intravenous contrast. Angiographic images of the Circle of Willis were obtained using MRA technique without intravenous contrast. Angiographic images of the neck were obtained using MRA technique without and with intravenous contrast. Carotid stenosis measurements (when applicable) are obtained utilizing NASCET criteria, using the distal internal carotid diameter as the denominator. CONTRAST:  9 cc  Gadavist COMPARISON:  12/28/2018 CT head FINDINGS: MRI HEAD FINDINGS Brain: No acute infarction, hemorrhage, hydrocephalus, extra-axial collection or mass lesion. Vascular: Normal flow voids. Skull and upper cervical spine: Normal marrow signal. Sinuses/Orbits: Mild maxillary sinus mucosal thickening. No abnormal signal of the additional visible paranasal sinuses or the mastoid air cells. Orbits are unremarkable. Other: None. MRA HEAD FINDINGS Internal carotid arteries:  Patent. Anterior cerebral arteries:  Patent. Middle cerebral arteries: Patent. Anterior communicating artery: Patent. Posterior communicating arteries: Not identified, likely hypoplastic or absent. Posterior cerebral arteries:  Patent. Basilar artery:  Patent. Vertebral arteries:  Patent. No evidence of high-grade stenosis, large vessel occlusion, or aneurysm. MRA NECK FINDINGS Aortic arch: Patent. Right common carotid artery: Patent. Right internal carotid artery: Patent. Right vertebral artery: Patent. Left common carotid artery: Patent. Left Internal carotid artery: Patent. Left Vertebral artery: Patent. There is no evidence of hemodynamically significant stenosis by NASCET criteria, occlusion, or aneurysm. IMPRESSION: 1. No acute intracranial abnormality identified. Unremarkable MRI of the head. 2. Normal MRA of the head. 3. Normal MRA of the neck. Electronically Signed   By: Kristine Garbe M.D.   On: 12/28/2018 02:51   Dg Chest Port 1 View  Result Date: 12/28/2018 CLINICAL DATA:  Near syncope, lightheaded, and dizzy. EXAM: PORTABLE CHEST 1 VIEW COMPARISON:  None. FINDINGS: Cardiac enlargement. No vascular congestion, edema, or consolidation. No blunting of costophrenic angles. No pneumothorax. Mediastinal contours appear intact. IMPRESSION: Cardiac enlargement. No evidence of active pulmonary disease. Electronically Signed   By: Lucienne Capers M.D.   On: 12/28/2018 00:31    EKG interpretation by me: Atrial fibrillation with  rate of 90 BPM. Normal axis. T wave flattening in V1 and V2. No evidence of ischemia.   ASSESSMENT AND PLAN:   Rate controlled atrial fibrillation:   - Troponin x3 negative. No indication for stress test at this time given no chest pain.  - ECHO on 01/2018 with mild aortic sclerosis. Repeat ECHO today was unremarkable.  - Carotid US with <50% stenosis bilaterally - Patient is not a cardioversion candidate at this time  - Will discuss possibility of ablation at later date - Will defer start of AV nodal blocker given history of 10 second pause during last admission  - CHA2DS2-VASc score 0. No indication for anticoagulation at this time. Continue aspirin 81 mg.  - Follow up in clinic in next 1-2 weeks  Sleep apnea:  Patient reports no one has been following his sleep apnea for several years. Remains compliant on CPAP machine, however has not been refitted or retest in many years. Recommend follow up with PCP for evaluation and further management of sleep apnea  Signed: Hilbert Odor, PA-C 12/28/2018, 8:34 AM

## 2018-12-29 LAB — HIV ANTIBODY (ROUTINE TESTING W REFLEX): HIV Screen 4th Generation wRfx: NONREACTIVE

## 2019-12-08 ENCOUNTER — Emergency Department
Admission: EM | Admit: 2019-12-08 | Discharge: 2019-12-08 | Disposition: A | Discharge status: Home / Self Care | Payer: BC Managed Care – PPO | Attending: Emergency Medicine | Admitting: Emergency Medicine

## 2019-12-08 ENCOUNTER — Other Ambulatory Visit: Payer: Self-pay

## 2019-12-08 ENCOUNTER — Emergency Department: Payer: BC Managed Care – PPO

## 2019-12-08 DIAGNOSIS — Z79899 Other long term (current) drug therapy: Secondary | ICD-10-CM | POA: Diagnosis not present

## 2019-12-08 DIAGNOSIS — R55 Syncope and collapse: Secondary | ICD-10-CM | POA: Insufficient documentation

## 2019-12-08 DIAGNOSIS — Z87891 Personal history of nicotine dependence: Secondary | ICD-10-CM | POA: Insufficient documentation

## 2019-12-08 DIAGNOSIS — I480 Paroxysmal atrial fibrillation: Secondary | ICD-10-CM

## 2019-12-08 DIAGNOSIS — Z7982 Long term (current) use of aspirin: Secondary | ICD-10-CM | POA: Diagnosis not present

## 2019-12-08 DIAGNOSIS — R009 Unspecified abnormalities of heart beat: Secondary | ICD-10-CM | POA: Diagnosis present

## 2019-12-08 LAB — CBC
HCT: 44.7 % (ref 39.0–52.0)
Hemoglobin: 15.9 g/dL (ref 13.0–17.0)
MCH: 32.6 pg (ref 26.0–34.0)
MCHC: 35.6 g/dL (ref 30.0–36.0)
MCV: 91.6 fL (ref 80.0–100.0)
Platelets: 225 10*3/uL (ref 150–400)
RBC: 4.88 MIL/uL (ref 4.22–5.81)
RDW: 12.6 % (ref 11.5–15.5)
WBC: 8.4 10*3/uL (ref 4.0–10.5)
nRBC: 0 % (ref 0.0–0.2)

## 2019-12-08 LAB — BASIC METABOLIC PANEL
Anion gap: 12 (ref 5–15)
BUN: 24 mg/dL — ABNORMAL HIGH (ref 6–20)
CO2: 24 mmol/L (ref 22–32)
Calcium: 10.4 mg/dL — ABNORMAL HIGH (ref 8.9–10.3)
Chloride: 103 mmol/L (ref 98–111)
Creatinine, Ser: 0.99 mg/dL (ref 0.61–1.24)
GFR calc Af Amer: >60 mL/min (ref >60)
GFR calc non Af Amer: >60 mL/min (ref >60)
Glucose, Bld: 118 mg/dL — ABNORMAL HIGH (ref 70–99)
Potassium: 3.4 mmol/L — ABNORMAL LOW (ref 3.5–5.1)
Sodium: 139 mmol/L (ref 135–145)

## 2019-12-08 LAB — TROPONIN I (HIGH SENSITIVITY)
Troponin I (High Sensitivity): 4 ng/L (ref <18)
Troponin I (High Sensitivity): 6 ng/L (ref <18)

## 2019-12-08 NOTE — ED Triage Notes (Signed)
Patient reports having irregular heart rate.  Reports syncopal episode.  Patient also reports has had a cardiac event with chest compressions before.

## 2019-12-08 NOTE — ED Provider Notes (Addendum)
[facility] Emergency Department Provider Note  Time seen: 9:30 AM  I have reviewed the triage vital signs and the nursing notes.   HISTORY  Chief Complaint Irregular Heart Beat   HPI Jesse Santiago is a 54 y.o. male with a past medical history of paroxysmal atrial fibrillation, syncopal episodes, hyperlipidemia, presents to the emergency department for any irregular heartbeat any syncopal episode.  According to the patient over the past several years he has been experiencing paroxysmal atrial fibrillation is not currently on any rate control medications or blood thinners as it was occurring only approximately once per year.  This is the second time in the past several months that this has occurred in which the patient feels his heartbeat irregularly.  Patient states he also has a history of syncopal episodes states he has had a multitude of work-ups for this and they have told him that it is likely "vagal."  Patient states last night he did have somewhat of an upset stomach, states he was experiencing lots of gas he began feeling his heart beating irregularly had a near syncopal episode.  States he rested for several hours, he continued to have an irregular heartbeat and had a brief syncopal episode so he came to the emergency department for evaluation.  Here the patient appears well, no acute distress, denies any chest pain at any point.   Past Medical History:  Diagnosis Date  . Hyperlipidemia   . Paroxysmal atrial fibrillation (HCC)   . Sleep apnea   . Syncope     Patient Active Problem List   Diagnosis Date Noted  . Paroxysmal atrial fibrillation (HCC)   . Sleep apnea   . A-fib ([place]) 01/28/2018  . Hyperlipidemia 05/02/2016  . Obstructive sleep apnea 05/02/2016    Past Surgical History:  Procedure Laterality Date  . APPENDECTOMY    . COLONOSCOPY WITH PROPOFOL N/A 07/17/2015   Procedure: COLONOSCOPY WITH PROPOFOL;  Surgeon: Josefine Class, MD;  Location:  [facility] ENDOSCOPY;  Service: Endoscopy;  Laterality: N/A;    Prior to Admission medications   Medication Sig Start Date End Date Taking? Authorizing Provider  aspirin 81 MG tablet Take 81 mg by mouth daily.    [provider]  atorvastatin (LIPITOR) 40 MG tablet TAKE 1 TABLET BY MOUTH EVERY DAY 01/30/18   [provider]  fluticasone (FLONASE) 50 MCG/ACT nasal spray Place 1 spray into both nostrils daily as needed.     [provider]  Multiple Vitamin (MULTIVITAMIN WITH MINERALS) TABS tablet Take 1 tablet by mouth daily.    [provider]  OMEGA-3 FATTY ACIDS PO Take by mouth.    [provider]    No Known Allergies  No family history on file.  Social History Social History   Tobacco Use  . Smoking status: Never Smoker  . Smokeless tobacco: Former Systems developer    Types: Chew  Substance Use Topics  . Alcohol use: Not on file  . Drug use: Not on file    Review of Systems Constitutional: Negative for fever. Cardiovascular: Negative for chest pain.  Positive for irregular heartbeat. Respiratory: Negative for shortness of breath.  Negative for cough. Gastrointestinal: Negative for abdominal pain, but positive for increased gas Musculoskeletal: Negative for musculoskeletal complaints Neurological: Negative for headache All other ROS negative  ____________________________________________   PHYSICAL EXAM:  VITAL SIGNS: ED Triage Vitals  Enc Vitals Group     BP 12/08/19 0414 118/81     Pulse Rate 12/08/19 0414  76     Resp 12/08/19 0414 18     Temp 12/08/19 0414 97.7 F (36.5 C)     Temp Source 12/08/19 0414 Oral     SpO2 12/08/19 0414 96 %     Weight 12/08/19 0410 225 lb (102.1 kg)     Height 12/08/19 0410 5\' 10"  (1.778 m)     Head Circumference --      Peak Flow --      Pain Score 12/08/19 0410 0     Pain Loc --      Pain Edu? --      Excl. in [place]? --    Constitutional: Alert and oriented. Well appearing and in no distress. Eyes:  Normal exam ENT      Head: Normocephalic and atraumatic.      Mouth/Throat: Mucous membranes are moist. Cardiovascular: Irregular rhythm rate around 80 bpm. Respiratory: Normal respiratory effort without tachypnea nor retractions. Breath sounds are clear Gastrointestinal: Soft and nontender. No distention.   Musculoskeletal: Nontender with normal range of motion in all extremities.  Neurologic:  Normal speech and language. No gross focal neurologic deficits Skin:  Skin is warm, dry and intact.  Psychiatric: Mood and affect are normal.   ____________________________________________    EKG  EKG viewed and interpreted by myself shows atrial fibrillation at 88 bpm with a narrow QRS, normal axis, normal intervals nonspecific ST changes without ST elevation.  ____________________________________________    RADIOLOGY  x-ray clear  ____________________________________________   INITIAL IMPRESSION / ASSESSMENT AND PLAN / ED COURSE  Pertinent labs & imaging results that were available during my care of the patient were reviewed by me and considered in my medical decision making (see chart for details).   Patient presents emergency department after syncopal episode and irregular heartbeat.  History of syncopal episodes in the past due to vagal events per patient.  History of paroxysmal atrial fibrillation.  Patient states he has had many work-ups in the past including CT scans of his heart, echocardiogram, 2 different Holter monitors, onset of all head normal findings besides paroxysmal atrial fibrillation.  Patient sees Dr. Saralyn Pilar for cardiology.  Patient's lab work is largely reassuring including 2 - troponins.  We will watch on telemetry for the next hour to evaluate rhythm.  Otherwise patient appears well and I believe would be appropriate for outpatient follow-up.  Patient continues to appear well remains in A. fib but very well controlled in 70s to 80s.  I spoke to Dr. Ubaldo Glassing regarding  the patient, he has reviewed the patient's records and believe the patient would be safe for discharge home as well with follow-up in the office for a Holter monitor.  I discussed with the patient plenty of rest today and follow-up with cardiology tomorrow.  I also discussed return precautions.  Patient agreeable to plan of care.  Huntington Sendejo was evaluated in Emergency Department on 12/08/2019 for the symptoms described in the history of present illness. He was evaluated in the context of the global COVID-19 pandemic, which necessitated consideration that the patient might be at risk for infection with the SARS-CoV-2 virus that causes COVID-19. Institutional protocols and algorithms that pertain to the evaluation of patients at risk for COVID-19 are in a state of rapid change based on information released by regulatory bodies including the CDC and federal and state organizations. These policies and algorithms were followed during the patient's care in the ED.  ____________________________________________   FINAL CLINICAL IMPRESSION(S) / ED DIAGNOSES  Atrial  fibrillation Syncope    Harvest Dark, MD 12/08/19 [id number]    Harvest Dark, MD 12/08/19 1158

## 2019-12-08 NOTE — ED Notes (Signed)
Report received from Jennifer, RN

## 2019-12-08 NOTE — ED Notes (Signed)
Patient transported to X-ray 

## 2019-12-08 NOTE — ED Notes (Signed)
Patient assisted to bathroom 

## 2020-11-06 ENCOUNTER — Other Ambulatory Visit: Payer: Self-pay | Admitting: Orthopedic Surgery

## 2020-11-06 DIAGNOSIS — M4807 Spinal stenosis, lumbosacral region: Secondary | ICD-10-CM

## 2020-11-06 DIAGNOSIS — G8929 Other chronic pain: Secondary | ICD-10-CM

## 2020-12-02 ENCOUNTER — Other Ambulatory Visit: Payer: BC Managed Care – PPO

## 2020-12-20 ENCOUNTER — Other Ambulatory Visit: Payer: Self-pay

## 2020-12-20 ENCOUNTER — Ambulatory Visit
Admission: RE | Admit: 2020-12-20 | Discharge: 2020-12-20 | Disposition: A | Discharge status: Home / Self Care | Payer: BC Managed Care – PPO | Source: Ambulatory Visit | Attending: Orthopedic Surgery | Admitting: Orthopedic Surgery

## 2020-12-20 DIAGNOSIS — M4807 Spinal stenosis, lumbosacral region: Secondary | ICD-10-CM

## 2020-12-20 DIAGNOSIS — G8929 Other chronic pain: Secondary | ICD-10-CM

## 2022-04-28 ENCOUNTER — Other Ambulatory Visit: Payer: Self-pay

## 2022-04-28 ENCOUNTER — Emergency Department: Payer: BC Managed Care – PPO

## 2022-04-28 ENCOUNTER — Emergency Department
Admission: EM | Admit: 2022-04-28 | Discharge: 2022-04-28 | Disposition: A | Discharge status: Home / Self Care | Payer: BC Managed Care – PPO | Attending: Emergency Medicine | Admitting: Emergency Medicine

## 2022-04-28 DIAGNOSIS — I10 Essential (primary) hypertension: Secondary | ICD-10-CM | POA: Diagnosis not present

## 2022-04-28 DIAGNOSIS — R030 Elevated blood-pressure reading, without diagnosis of hypertension: Secondary | ICD-10-CM | POA: Diagnosis present

## 2022-04-28 LAB — CBC
HCT: 47.3 % (ref 39.0–52.0)
Hemoglobin: 16.1 g/dL (ref 13.0–17.0)
MCH: 31.9 pg (ref 26.0–34.0)
MCHC: 34 g/dL (ref 30.0–36.0)
MCV: 93.8 fL (ref 80.0–100.0)
Platelets: 239 10*3/uL (ref 150–400)
RBC: 5.04 MIL/uL (ref 4.22–5.81)
RDW: 13 % (ref 11.5–15.5)
WBC: 5.8 10*3/uL (ref 4.0–10.5)
nRBC: 0 % (ref 0.0–0.2)

## 2022-04-28 LAB — BASIC METABOLIC PANEL
Anion gap: 7 (ref 5–15)
BUN: 16 mg/dL (ref 6–20)
CO2: 29 mmol/L (ref 22–32)
Calcium: 9.2 mg/dL (ref 8.9–10.3)
Chloride: 104 mmol/L (ref 98–111)
Creatinine, Ser: 0.93 mg/dL (ref 0.61–1.24)
GFR, Estimated: >60 mL/min (ref >60)
Glucose, Bld: 121 mg/dL — ABNORMAL HIGH (ref 70–99)
Potassium: 3.9 mmol/L (ref 3.5–5.1)
Sodium: 140 mmol/L (ref 135–145)

## 2022-04-28 LAB — TROPONIN I (HIGH SENSITIVITY): Troponin I (High Sensitivity): 3 ng/L (ref <18)

## 2022-04-28 MED ORDER — LISINOPRIL 10 MG PO TABS: 10.0000 mg | ORAL_TABLET | Freq: Every day | ORAL | 11 refills | Status: AC

## 2022-04-28 NOTE — ED Notes (Signed)
D/C and reasons to return and new RX dicussed with pt, pt verbalized understanding. NAD noted. Pt ambulatory on D/C.

## 2022-04-28 NOTE — ED Triage Notes (Signed)
Pt here with hypertension. 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 Pt having lower back pain which he was taking injections for but they have not helped. Pt denies N/V/D.

## 2022-04-28 NOTE — ED Provider Notes (Signed)
[facility] Provider Note    Event Date/Time   First MD Initiated Contact with Patient 04/28/22 1012     (approximate)   History   Hypertension   HPI  Jesse Santiago is a 56 y.o. male with a history of paroxysmal atrial fibrillation who presents with complaints of high blood pressure.  Patient reports this morning when he woke up he was feeling "off ".  He felt sluggish, when he got to work he had someone check his blood pressure and found to be significantly elevated over 629 systolic.  He present to the emergency department for evaluation.  He reports he is feeling improved now.  Denies headache.  No neurodeficits.  No chest pain.  No shortness of breath.  Has never taken anything for blood pressure     Physical Exam   Triage Vital Signs: ED Triage Vitals  Enc Vitals Group     BP 04/28/22 0946 (!) 146/96     Pulse Rate 04/28/22 0946 74     Resp 04/28/22 0946 18     Temp 04/28/22 0946 97.9 F (36.6 C)     Temp Source 04/28/22 0946 Oral     SpO2 04/28/22 0946 97 %     Weight 04/28/22 0947 102.1 kg (225 lb)     Height 04/28/22 0947 1.778 m ('5\' 10"'$ )     Head Circumference --      Peak Flow --      Pain Score 04/28/22 0946 8     Pain Loc --      Pain Edu? --      Excl. in [place]? --     Most recent vital signs: Vitals:   04/28/22 0946 04/28/22 1051  BP: (!) 146/96 (!) 133/92  Pulse: 74 75  Resp: 18 19  Temp: 97.9 F (36.6 C)   SpO2: 97% 99%     General: Awake, no distress.  CV:  Good peripheral perfusion.  Resp:  Normal effort.  Abd:  No distention.  Other:     ED Results / Procedures / Treatments   Labs (all labs ordered are listed, but only abnormal results are displayed) Labs Reviewed  BASIC METABOLIC PANEL - Abnormal; Notable for the following components:      Result Value   Glucose, Bld 121 (*)    All other components within normal limits  CBC  TROPONIN I (HIGH SENSITIVITY)  TROPONIN I (HIGH SENSITIVITY)     EKG  ED ECG  REPORT I, Lavonia Drafts, the attending physician, personally viewed and interpreted this ECG.  Date: 04/28/2022  Rhythm: normal sinus rhythm QRS Axis: normal Intervals: normal ST/T Wave abnormalities: normal Narrative Interpretation: no evidence of acute ischemia    RADIOLOGY Chest x-ray viewed interpret by me, no acute abnormality    PROCEDURES:  Critical Care performed:   Procedures   MEDICATIONS ORDERED IN ED: Medications - No data to display   IMPRESSION / MDM / [place] / ED COURSE  I reviewed the triage vital signs and the nursing notes. Patient's presentation is most consistent with acute presentation with potential threat to life or bodily function.  Patient presents with significantly elevated blood pressure without a history of hypertension.  Differential includes hypertensive urgency/emergency, ACS, electrolyte abnormality  On exam patient is well-appearing, he reports he is feeling better without intervention.  Blood pressure is improved without treatment  Lab work obtained, normal BMP, normal troponin, normal CBC, chest x-ray and EKG are reassuring  Considered  admission however because of improving blood pressure, reassuring work-up appropriate for discharge, will start the patient on lisinopril 10 mg as he has had multiple high readings over the last several days        FINAL CLINICAL IMPRESSION(S) / ED DIAGNOSES   Final diagnoses:  Hypertension, unspecified type     Rx / DC Orders   ED Discharge Orders          Ordered    lisinopril (ZESTRIL) 10 MG tablet  Daily        04/28/22 1044             Note:  This document was prepared using Dragon voice recognition software and may include unintentional dictation errors.   Lavonia Drafts, MD 04/28/22 1444

## 2022-04-28 NOTE — ED Notes (Signed)
Pt presents to ED with concerns of hypertension. Pt states no HX of it and states PCP has "sort of been watching it". Pt states he trialed lisinopril and states he was taken off of it due to his BP dropping to low causing syncopal episodes. Pt states HX of sciatica and does endorse bilateral foot numbness "for a few minutes". Pt states he has taken his BP a work and homer with different readings. Pt states he does get back injections for his back issues. Pt states minor HA at this time. Pt states he did wake up with some dizziness but sates that has since resolved. Pt is A&Ox4 at this time. Pt ambulatory with steady gait to RM 1 hall.   Pt states only current cardiac issue is he "gets a-fib once a year". Pt denies taking blood thinners or med for rate control for this.   Pt denies SOB or CP.

## 2022-12-06 ENCOUNTER — Ambulatory Visit: Payer: BC Managed Care – PPO | Admitting: Neurosurgery

## 2022-12-06 ENCOUNTER — Encounter: Payer: Self-pay | Admitting: Neurosurgery

## 2022-12-06 VITALS — BP 130/84 | Ht 70.0 in | Wt 236.0 lb

## 2022-12-06 DIAGNOSIS — M79672 Pain in left foot: Secondary | ICD-10-CM | POA: Diagnosis not present

## 2022-12-06 DIAGNOSIS — M5416 Radiculopathy, lumbar region: Secondary | ICD-10-CM

## 2022-12-06 DIAGNOSIS — M79671 Pain in right foot: Secondary | ICD-10-CM

## 2022-12-06 NOTE — Progress Notes (Signed)
Referring Physician:  Doyle Askew, MD [place],  Jesse Santiago [id number]  Primary Physician:  Jesse Hector, MD  History of Present Illness: 12/06/2022 [name] is a 57 y.o with a history of HLD, HTN, who is here today with a chief complaint of chronic and persistent lumbosacral complaints.  This started several years ago without any inciting event.  He states initially his symptoms were on the left however his left-sided pain has since resolved he now complains of right-sided low back pain that radiates into his right buttock and mid thigh.  He states that the pain is worse with standing and improved some with sitting.  He describes it as sharp pain.  He underwent several months of physical therapy and has undergone 4 injections.  He states that these things helped while he was doing them however his symptoms have returned.  In addition to this he began having pain in his bilateral heels after starting physical therapy that has persisted since.  He denies any pain, numbness, tingling that radiates down the length of his legs.  He denies any weakness.  Conservative measures:  Physical therapy: From 08/15/2022 to 10/24/2022 Multimodal medical therapy including regular antiinflammatories: OTCs  Injections: epidural steroid injections x 4 . Most recent on 07/29/22  Past Surgery: No previous spine surgeries  Jesse Santiago has no symptoms of cervical myelopathy.  The symptoms are causing a significant impact on the patient's life.   Review of Systems:  A 10 point review of systems is negative, except for the pertinent positives and negatives detailed in the HPI.  Past Medical History: Past Medical History:  Diagnosis Date   Hyperlipidemia    Paroxysmal atrial fibrillation ([place])    Sleep apnea    Syncope     Past Surgical History: Past Surgical History:  Procedure Laterality Date   APPENDECTOMY     COLONOSCOPY WITH PROPOFOL N/A 07/17/2015   Procedure:  COLONOSCOPY WITH PROPOFOL;  Surgeon: Jesse Class, MD;  Location: [facility] ENDOSCOPY;  Service: Endoscopy;  Laterality: N/A;    Allergies: Allergies as of 12/06/2022   (No Known Allergies)    Medications: Outpatient Encounter Medications as of 12/06/2022  Medication Sig   aspirin 81 MG tablet Take 81 mg by mouth daily.   atorvastatin (LIPITOR) 40 MG tablet TAKE 1 TABLET BY MOUTH EVERY DAY   fluticasone (FLONASE) 50 MCG/ACT nasal spray Place 1 spray into both nostrils daily as needed.    lisinopril (ZESTRIL) 10 MG tablet Take 1 tablet (10 mg total) by mouth daily.   Multiple Vitamin (MULTIVITAMIN WITH MINERALS) TABS tablet Take 1 tablet by mouth daily.   OMEGA-3 FATTY ACIDS PO Take by mouth.   No facility-administered encounter medications on file as of 12/06/2022.    Social History: Social History   Tobacco Use   Smoking status: Never   Smokeless tobacco: Former    Types: Chew    Quit date: 12/27/2016    Family Medical History: No family history on file.  Physical Examination:  Today's Vitals   12/06/22 0906  BP: 130/84  Weight: 107 kg  Height: '5\' 10"'$  (1.778 m)  PainSc: 3   PainLoc: Back   Body mass index is 33.86 kg/m.   General: Patient is well developed, well nourished, calm, collected, and in no apparent distress. Attention to examination is appropriate.  Psychiatric: Patient is non-anxious.  Head:  Pupils equal, round, and reactive to light.  ENT:  Oral mucosa appears well  hydrated.  Neck:   Supple.  Full range of motion.  Respiratory: Patient is breathing without any difficulty.  Extremities: No edema.  Vascular: Palpable dorsal pedal pulses.  Skin:   On exposed skin, there are no abnormal skin lesions.  NEUROLOGICAL:     Awake, alert, oriented to person, place, and time.  Speech is clear and fluent. Fund of knowledge is appropriate.   Cranial Nerves: Pupils equal round and reactive to light.  Facial tone is symmetric.  Facial sensation is  symmetric.  ROM of spine: full.  Palpation of spine: non tender.    Strength: Side Biceps Triceps Deltoid Interossei Grip Wrist Ext. Wrist Flex.  R '5 5 5 5 5 5 5  '$ L '5 5 5 5 5 5 5   '$ Side Iliopsoas Quads Hamstring PF DF EHL  R '5 5 5 5 5 5  '$ L '5 5 5 5 5 5   '$ Reflexes are 2+ and symmetric at the biceps, triceps, brachioradialis, patella and achilles.   Hoffman's is absent.  Clonus is not present.  Toes are down-going.  Bilateral upper and lower extremity sensation is intact to light touch.    Gait is normal.   No difficulty with tandem gait.    Medical Decision Making  Imaging: MRI L spine 12/20/20      FINDINGS: Segmentation: Standard. Lowest well-formed disc space labeled the L5-S1 level.   Alignment: Trace dextroscoliosis. Straightening and slight reversal of the normal lumbar lordosis. No listhesis.   Vertebrae: Vertebral body height maintained without acute or chronic fracture. Bone marrow signal intensity within normal limits. No discrete or worrisome osseous lesions. Mild discogenic reactive endplate change noted about the L4-5 interspace. No other abnormal marrow edema.   Conus medullaris and cauda equina: Conus extends to the L1 level. Conus and cauda equina appear normal.   Paraspinal and other soft tissues: Paraspinous soft tissues within normal limits. Visualized visceral structures grossly unremarkable. Retroaortic left renal vein noted.   Disc levels:   Diffuse congenital shortening of the pedicles with underlying congenital spinal stenosis noted.   T12-L1: Negative interspace. Mild right greater than left facet hypertrophy. No spinal stenosis. Foramina remain patent.   L1-2: Diffuse disc bulge with disc desiccation. Underlying short pedicles with mild prominence of the dorsal epidural fat. Degree of underlying mild congenital spinal stenosis. Mild to moderate left greater than right L1 foraminal narrowing.   L2-3: Diffuse disc bulge with disc  desiccation and intervertebral disc space narrowing. Associated reactive endplate change with marginal endplate spurring. Mild bilateral facet hypertrophy. Prominence of the dorsal epidural fat with short pedicles. Resultant moderate spinal stenosis. Mild right with mild to moderate left L2 foraminal narrowing.   L3-4: Diffuse disc bulge with disc desiccation and intervertebral disc space narrowing. Associated reactive endplate spurring. Changes slightly asymmetric to the left. Mild facet and ligament flavum hypertrophy. Short pedicles with mild prominence of the dorsal epidural fat. Resultant moderate canal with left lateral recess stenosis. Mild right with moderate to severe left L3 foraminal narrowing.   L4-5: Diffuse disc bulge with disc desiccation and intervertebral disc space narrowing. Associated discogenic reactive endplate change with marginal endplate spurring. Moderate facet and ligament flavum hypertrophy. Underlying short pedicles. Resultant moderate spinal stenosis with severe bilateral lateral recess stenosis. Moderate bilateral L4 foraminal narrowing.   L5-S1: Mild diffuse disc bulge with disc desiccation and intervertebral disc space narrowing. Mild reactive endplate change. Moderate bilateral facet hypertrophy. No significant spinal stenosis. Severe right worse than left L5 foraminal narrowing.  IMPRESSION: 1. 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 2. Multifactorial degenerative changes with resultant multilevel foraminal narrowing as above. Notable findings include moderate to severe left L3 foraminal stenosis, moderate bilateral L4 foraminal narrowing, with severe right worse than left L5 foraminal stenosis.     Electronically Signed   By: Jeannine Boga M.D.   On: 12/21/2020 00:15    I have personally reviewed the images and agree with the above interpretation.  Assessment  and Plan: [name] is a pleasant 57 y.o. male with longstanding history of lumbosacral complaints that has been refractory to recent conservative management.  His MRI from 2022 does show significant bilateral neuroforaminal stenosis that likely explains his symptoms.  We discussed that his heel pain could be due to to this versus an intrinsic heel pathology.  I would like for him to see podiatry to evaluate this further.  I have placed a referral for this.  I have also placed an order for an updated MRI.  I will contact him via telephone visit to review his MRI results and further plan of care.  Ultimately I will likely refer him to Dr. Cari Caraway to discuss surgical options.  He expressed understanding was in agreement with this plan.  Thank you for involving me in the care of this patient.   I spent a total of 37 minutes in both face-to-face and non-face-to-face activities for this visit on the date of this encounter including review of outside records, review of imaging, discussion of of treatment options, documentation, and order placement.   [facility]

## 2022-12-28 ENCOUNTER — Ambulatory Visit
Admission: RE | Admit: 2022-12-28 | Discharge: 2022-12-28 | Disposition: A | Discharge status: Home / Self Care | Payer: BC Managed Care – PPO | Source: Ambulatory Visit | Attending: Neurosurgery | Admitting: Neurosurgery

## 2022-12-28 DIAGNOSIS — M79671 Pain in right foot: Secondary | ICD-10-CM

## 2022-12-28 DIAGNOSIS — M5416 Radiculopathy, lumbar region: Secondary | ICD-10-CM

## 2023-01-10 ENCOUNTER — Ambulatory Visit (INDEPENDENT_AMBULATORY_CARE_PROVIDER_SITE_OTHER): Payer: BC Managed Care – PPO | Admitting: Neurosurgery

## 2023-01-10 DIAGNOSIS — M5416 Radiculopathy, lumbar region: Secondary | ICD-10-CM

## 2023-01-10 DIAGNOSIS — M48062 Spinal stenosis, lumbar region with neurogenic claudication: Secondary | ICD-10-CM

## 2023-01-10 NOTE — Progress Notes (Signed)
Neurosurgery Telephone (Audio-Only) Note  Requesting Provider     Adin Hector, MD [place] [facility] [place],  [place] [address] T: [phone] F: [phone]  Primary Care Provider Adin Hector, MD [place] [address] T: [phone] F: [phone]  Telehealth visit was conducted with Jesse Santiago, a 57 y.o. male via telephone. The patient is aware of the limitation of telemedicine   History of Present Illness: Jesse Santiago is a 57 y.o presenting today via telephone visit to review his MRI results and discuss next steps in the plan of care.  He has since seen podiatry and has been diagnosed with bilateral plantar fasciitis as suspected at his last visit.  In reviewing their note there is a treatment plan in place.  12/06/2022 Jesse Santiago is a 57 y.o with a history of HLD, HTN, who is here today with a chief complaint of chronic and persistent lumbosacral complaints.  This started several years ago without any inciting event.  He states initially his symptoms were on the left however his left-sided pain has since resolved he now complains of right-sided low back pain that radiates into his right buttock and mid thigh.  He states that the pain is worse with standing and improved some with sitting.  He describes it as sharp pain.  He underwent several months of physical therapy and has undergone 4 injections.  He states that these things helped while he was doing them however his symptoms have returned.  In addition to this he began having pain in his bilateral heels after starting physical therapy that has persisted since.  He denies any pain, numbness, tingling that radiates down the length of his legs.  He denies any weakness.   Conservative measures:  Physical therapy: From 08/15/2022 to 10/24/2022 (See notes in care everywhere) Multimodal medical therapy including regular antiinflammatories: OTCs  Injections:  epidural steroid injections x 4 . Most recent on 07/29/22   Past Surgery: No previous spine surgeries   Jesse Santiago has no symptoms of cervical myelopathy.   The symptoms are causing a significant impact on the patient's life.   General Review of Systems:  A ROS was performed including pertinent positive and negatives as documented.  All other systems are negative.   Prior to Admission medications   Medication Sig Start Date End Date Taking? Authorizing Provider  aspirin 81 MG tablet Take 81 mg by mouth daily.    [provider]  fluticasone (FLONASE) 50 MCG/ACT nasal spray Place 1 spray into both nostrils daily as needed.     [provider]  lisinopril (ZESTRIL) 10 MG tablet Take 1 tablet (10 mg total) by mouth daily. 04/28/22 04/28/23  Lavonia Drafts, MD  Multiple Vitamin (MULTIVITAMIN WITH MINERALS) TABS tablet Take 1 tablet by mouth daily.    [provider]  OMEGA-3 FATTY ACIDS PO Take by mouth.    [provider]     DATA REVIEWED    Imaging Studies  MRI L spine 12/28/22 IMPRESSION: 1. Mildly improved spinal stenosis at L3-4. 2. 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 3. Moderate to severe spinal and neural foraminal stenosis at L4-5 and moderate spinal stenosis at L2-3. 4. Severe bilateral neural foraminal stenosis at L5-S1.     Electronically Signed   By: Logan Bores M.D.   On: 12/28/2022 18:01  IMPRESSION  Jesse Santiago is a 57 y.o. male who  I performed a telephone encounter today for evaluation and management of: Lumbar stenosis and radiculopathy  PLAN  Jesse Santiago is a 57 y.o presenting today via telephone visit to review his MRI results.  He continues to have significant back and right leg pain.  His MRI shows multilevel degenerative changes with neuroforaminal stenosis particularly on the right at L4-5 and L5-S1.  He has completed conservative meant without any significant relief  of his symptoms.  He would like to see Dr. Cari Caraway to discuss possibility of surgical intervention.  He currently works as a Airline pilot and was asking questions about time off I deferred this question to Dr. Izora Ribas  No orders of the defined types were placed in this encounter.  DISPOSITION  Follow up: In person appointment in  with Dr. Rodena Santiago, [place]   This visit was performed via telephone.  Patient location: home Provider location: office  I spent a total of 5 minutes non-face-to-face activities for this visit on the date of this encounter including review of current clinical condition and response to treatment.  The patient is aware of and accepts the limits of this telehealth visit.

## 2023-01-22 IMAGING — MR MR LUMBAR SPINE W/O CM
4 of 5 series · 26 of 48 positions shown · non-contrast
Comparison: None available.

CLINICAL DATA: Initial evaluation for chronic low back pain with
stiffness, bilateral buttock and leg pain.

EXAM:
MRI LUMBAR SPINE WITHOUT CONTRAST
TECHNIQUE: Multiplanar, multisequence MR imaging of the lumbar spine was
performed. No intravenous contrast was administered.

[Series 3: T2 · sagittal · 4.0mm · 1.09mm/px · 6 of 17 slices shown (1 of 2)]
[im 1/17]
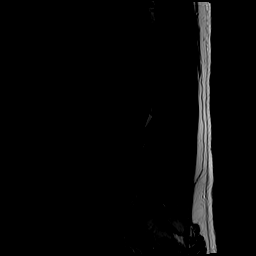
[im 4/17]
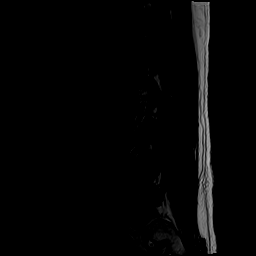
[im 7/17]
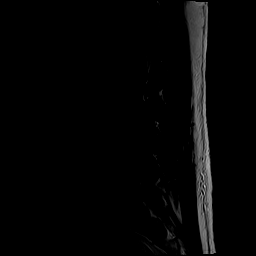
[im 10/17]
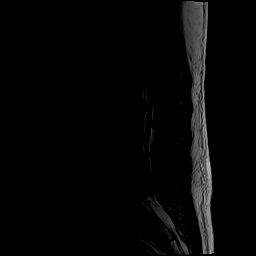
[im 13/17]
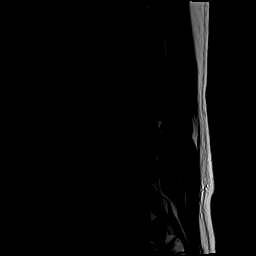
[im 17/17]
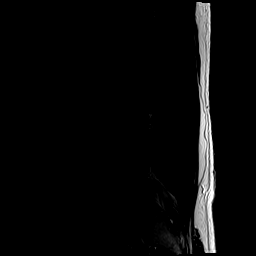

[Series 5: T1 · sagittal · 4.0mm · 1.09mm/px · 6 of 17 slices shown (1 of 2)]
[im 1/17]
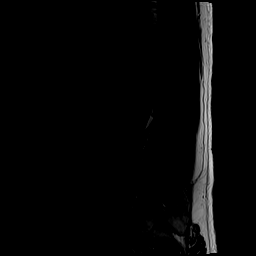
[im 4/17]
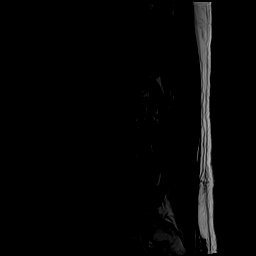
[im 7/17]
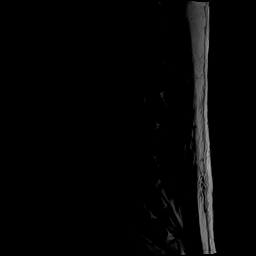
[im 10/17]
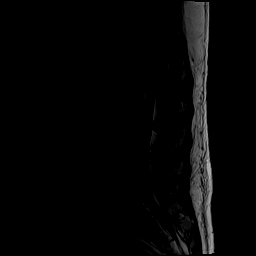
[im 13/17]
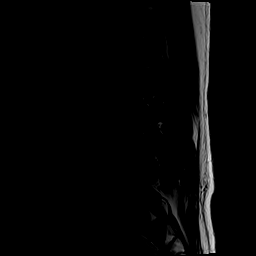
[im 17/17]
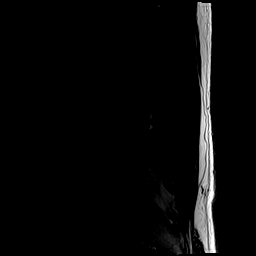

[Series 6: T2 · axial · 4.0mm · 0.39mm/px · z∈[-72,+143]mm · 9 of 43 slices shown (2 of 2)]
[im 1/43]
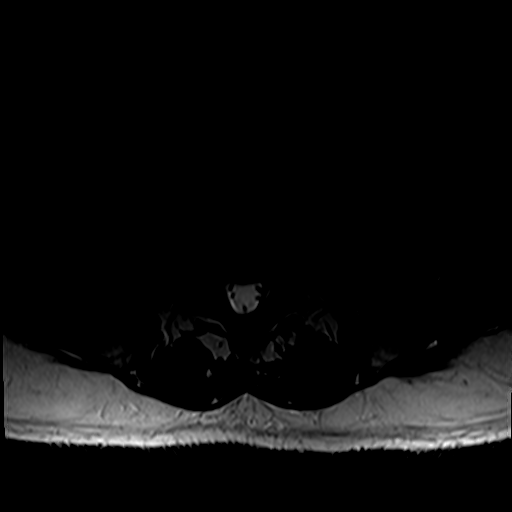
[im 7/43]
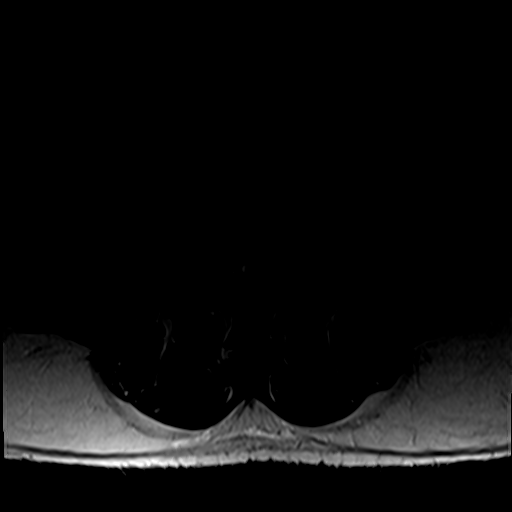
[im 13/43]
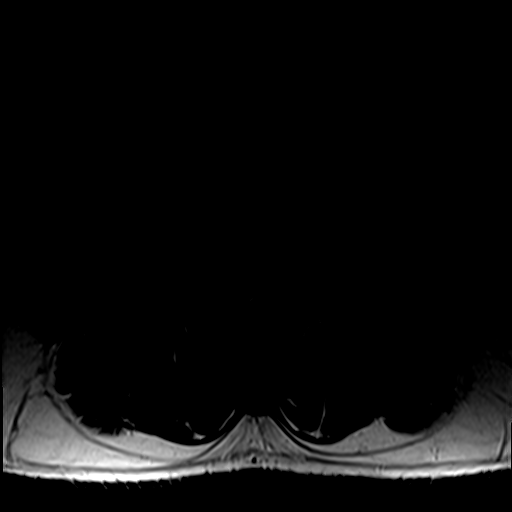
[im 19/43]
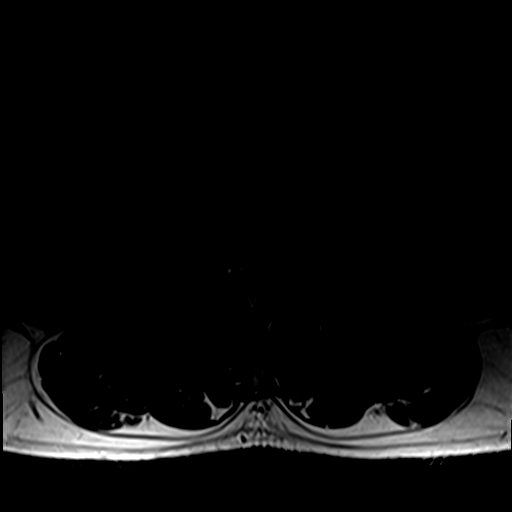
[im 22/43]
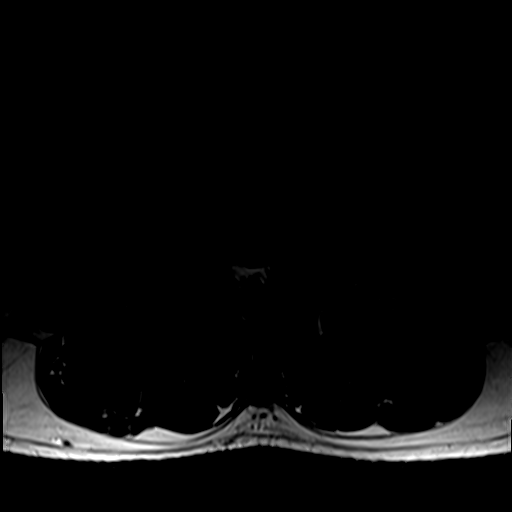
[im 25/43]
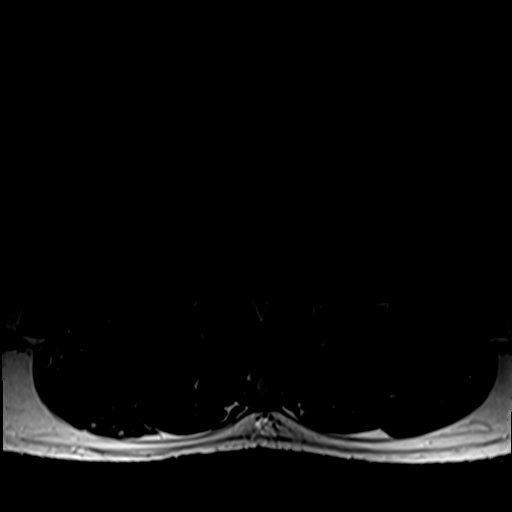
[im 31/43]
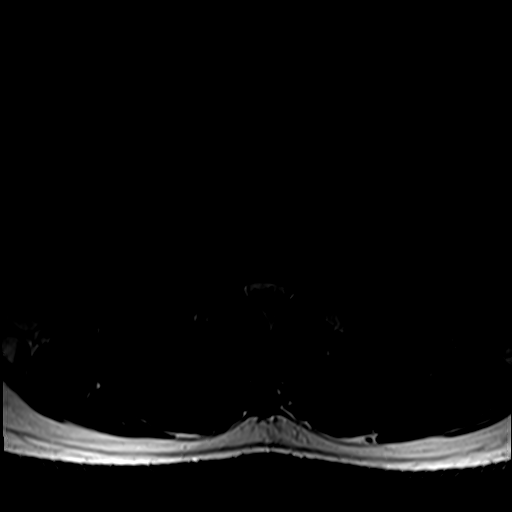
[im 37/43]
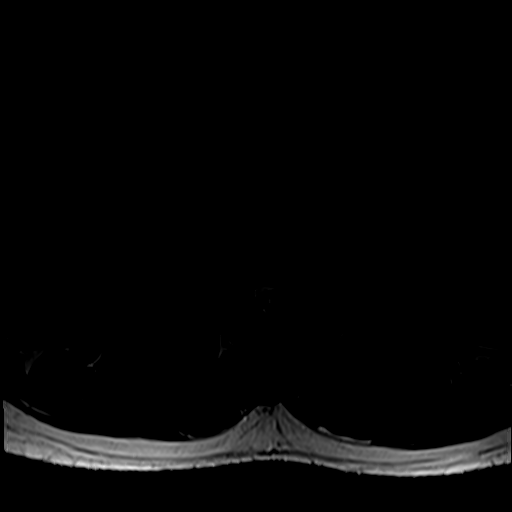
[im 43/43]
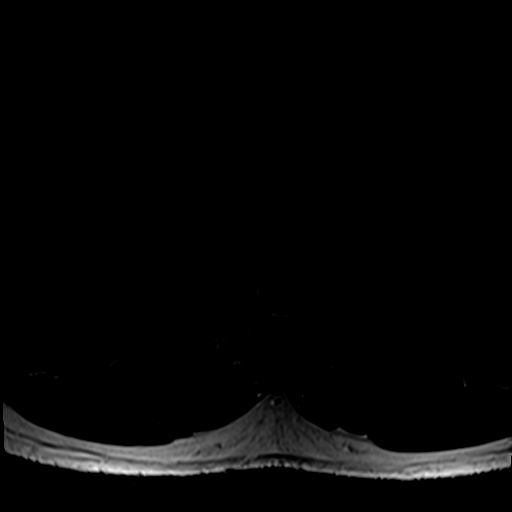

[Series 7: T1 · axial · 4.0mm · 0.39mm/px · z∈[-72,+114]mm · 5 of 43 slices shown (2 of 2)]
[im 1/43]
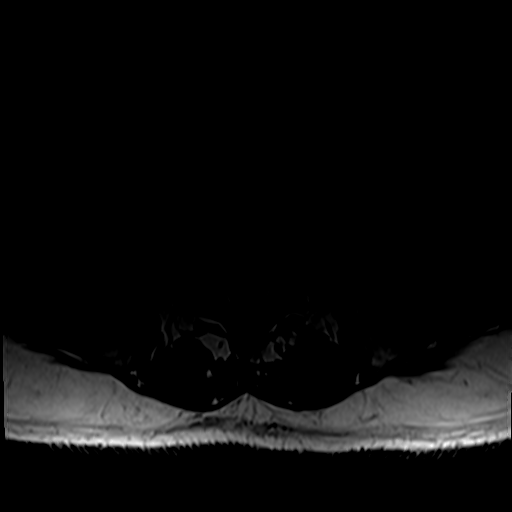
[im 7/43]
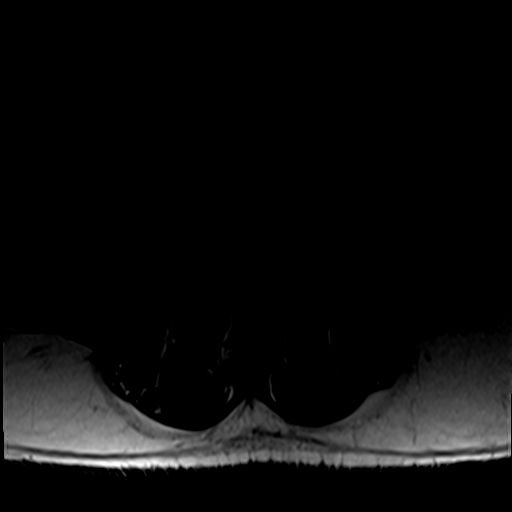
[im 13/43]
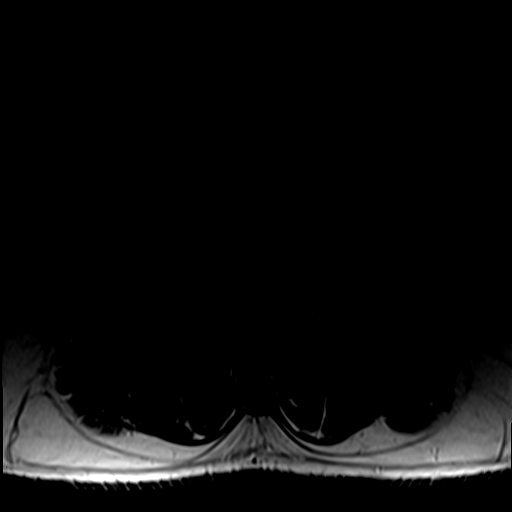
[im 22/43]
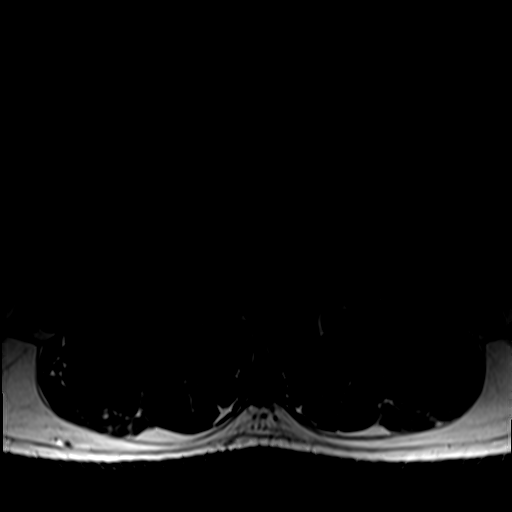
[im 37/43]
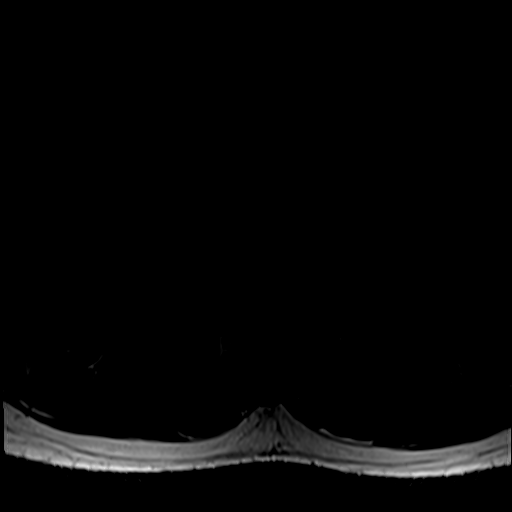

[26 of 48 positions shown; findings below may reference images not displayed]

FINDINGS: Segmentation: Standard. Lowest well-formed disc space labeled the
L5-S1 level.

Alignment: Trace dextroscoliosis. Straightening and slight reversal
of the normal lumbar lordosis. No listhesis.

Vertebrae: Vertebral body height maintained without acute or chronic
fracture. Bone marrow signal intensity within normal limits. No
discrete or worrisome osseous lesions. Mild discogenic reactive
endplate change noted about the L4-5 interspace. No other abnormal
marrow edema.

Conus medullaris and cauda equina: Conus extends to the L1 level.
Conus and cauda equina appear normal.

Paraspinal and other soft tissues: Paraspinous soft tissues within
normal limits. Visualized visceral structures grossly unremarkable.
Retroaortic left renal vein noted.

Disc levels:

Diffuse congenital shortening of the pedicles with underlying
congenital spinal stenosis noted.

T12-L1: Negative interspace. Mild right greater than left facet
hypertrophy. No spinal stenosis. Foramina remain patent.

L1-2: Diffuse disc bulge with disc desiccation. Underlying short
pedicles with mild prominence of the dorsal epidural fat. Degree of
underlying mild congenital spinal stenosis. Mild to moderate left
greater than right L1 foraminal narrowing.

L2-3: Diffuse disc bulge with disc desiccation and intervertebral
disc space narrowing. Associated reactive endplate change with
marginal endplate spurring. Mild bilateral facet hypertrophy.
Prominence of the dorsal epidural fat with short pedicles. Resultant
moderate spinal stenosis. Mild right with mild to moderate left L2
foraminal narrowing.

L3-4: Diffuse disc bulge with disc desiccation and intervertebral
disc space narrowing. Associated reactive endplate spurring. Changes
slightly asymmetric to the left. Mild facet and ligament flavum
hypertrophy. Short pedicles with mild prominence of the dorsal
epidural fat. Resultant moderate canal with left lateral recess
stenosis. Mild right with moderate to severe left L3 foraminal
narrowing.

L4-5: Diffuse disc bulge with disc desiccation and intervertebral
disc space narrowing. Associated discogenic reactive endplate change
with marginal endplate spurring. Moderate facet and ligament flavum
hypertrophy. Underlying short pedicles. Resultant moderate spinal
stenosis with severe bilateral lateral recess stenosis. Moderate
bilateral L4 foraminal narrowing.

L5-S1: Mild diffuse disc bulge with disc desiccation and
intervertebral disc space narrowing. Mild reactive endplate change.
Moderate bilateral facet hypertrophy. No significant spinal
stenosis. Severe right worse than left L5 foraminal narrowing.
IMPRESSION: 1. Acquired on congenital moderate diffuse spinal stenosis at L2-3
through L4-5 as above, most pronounced at L4-5.
2. Multifactorial degenerative changes with resultant multilevel
foraminal narrowing as above. Notable findings include moderate to
severe left L3 foraminal stenosis, moderate bilateral L4 foraminal
narrowing, with severe right worse than left L5 foraminal stenosis.

## 2023-01-30 NOTE — Progress Notes (Unsigned)
Referring Physician:  Loleta Dicker, [facility] [place],  [place] [address]  Primary Physician:  Adin Hector, MD  History of Present Illness: 01/31/2023 Jesse Santiago is here today with a chief complaint of   right-sided low back pain that radiates into his right buttock and mid thigh.  He has been having pain for approximately 2 years.  He has intermittently had issues prior to that.  He works as a Airline pilot, which sometimes requires wearing heavy packs.  This worsens his pain.  He reports right-sided lower back pain that radiates into his right greater than left thigh.  He has gotten heel pain since physical therapy.  His pain worsens when he stands, walks long time, or does yard work.  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  His activities frequently bother him, though he is able to do most of what he would like to do.  Sitting or laying down makes it better.   Bowel/Bladder Dysfunction: none  Conservative measures:  Physical therapy:  has participated in at [facility] from 08/15/22 to 10/24/22 Multimodal medical therapy including regular antiinflammatories:  gabapentin Injections:  has received epidural steroid injections 07/29/2022: Right S1 transforaminal ESI (50%) 06/10/2022: Bilateral L5-S1 transforaminal ESI (70% relief) 03/10/2022: Bilateral L5-S1 transforaminal ESI (50% relief) 12/06/2021: Bilateral L5-S1 transforaminal ESI (90% relief)   Past Surgery: denies  Adren Arola has no symptoms of cervical myelopathy.  The symptoms are causing a significant impact on the patient's life.   Progress Note from Cooper Render, [place] on 01/10/23:   History of Present Illness: Jesse Santiago is a 57 y.o presenting today via telephone visit to review his MRI results and discuss next steps in the plan of care.  He has since seen podiatry and has been diagnosed with bilateral plantar fasciitis as  suspected at his last visit.  In reviewing their note there is a treatment plan in place.   12/06/2022 Jesse Santiago is a 57 y.o with a history of HLD, HTN, who is here today with a chief complaint of chronic and persistent lumbosacral complaints.  This started several years ago without any inciting event.  He states initially his symptoms were on the left however his left-sided pain has since resolved he now complains of right-sided low back pain that radiates into his right buttock and mid thigh.  He states that the pain is worse with standing and improved some with sitting.  He describes it as sharp pain.  He underwent several months of physical therapy and has undergone 4 injections.  He states that these things helped while he was doing them however his symptoms have returned.  In addition to this he began having pain in his bilateral heels after starting physical therapy that has persisted since.  He denies any pain, numbness, tingling that radiates down the length of his legs.  He denies any weakness. I have utilized the care everywhere function in epic to review the outside records available from external health systems.  Review of Systems:  A 10 point review of systems is negative, except for the pertinent positives and negatives detailed in the HPI.  Past Medical History: Past Medical History:  Diagnosis Date   Hyperlipidemia    Paroxysmal atrial fibrillation ([place])    Sleep apnea    Syncope     Past Surgical History: Past Surgical History:  Procedure Laterality Date  APPENDECTOMY     COLONOSCOPY WITH PROPOFOL N/A 07/17/2015   Procedure: COLONOSCOPY WITH PROPOFOL;  Surgeon: Josefine Class, MD;  Location: [facility] ENDOSCOPY;  Service: Endoscopy;  Laterality: N/A;    Allergies: Allergies as of 01/31/2023 - Review Complete 01/31/2023  Allergen Reaction Noted   Atorvastatin Other (See Comments) 05/11/2020    Medications: Current Meds  Medication Sig   aspirin 81 MG tablet  Take 81 mg by mouth daily.   fluticasone (FLONASE) 50 MCG/ACT nasal spray Place 1 spray into both nostrils daily as needed.    lisinopril (ZESTRIL) 10 MG tablet Take 1 tablet (10 mg total) by mouth daily.   Multiple Vitamin (MULTIVITAMIN WITH MINERALS) TABS tablet Take 1 tablet by mouth daily.   OMEGA-3 FATTY ACIDS PO Take by mouth.    Social History: Social History   Tobacco Use   Smoking status: Never   Smokeless tobacco: Former    Types: Chew    Quit date: 12/27/2016    Family Medical History: No family history on file.  Physical Examination: Vitals:   01/31/23 1446  BP: 110/77    General: Patient is well developed, well nourished, calm, collected, and in no apparent distress. Attention to examination is appropriate.  Neck:   Supple.  Full range of motion.  Respiratory: Patient is breathing without any difficulty.   NEUROLOGICAL:     Awake, alert, oriented to person, place, and time.  Speech is clear and fluent.   Cranial Nerves: Pupils equal round and reactive to light.  Facial tone is symmetric.  Facial sensation is symmetric. Shoulder shrug is symmetric. Tongue protrusion is midline.  There is no pronator drift.  ROM of spine: full.    Strength: Side Biceps Triceps Deltoid Interossei Grip Wrist Ext. Wrist Flex.  R '5 5 5 5 5 5 5  '$ L '5 5 5 5 5 5 5   '$ Side Iliopsoas Quads Hamstring PF DF EHL  R '5 5 5 5 5 5  '$ L '5 5 5 5 5 5   '$ Reflexes are 1+ and symmetric at the biceps, triceps, brachioradialis, patella and achilles.   Hoffman's is absent.   Bilateral upper and lower extremity sensation is intact to light touch.    No evidence of dysmetria noted.  Gait is antalgic and slightly stooped forward.     Medical Decision Making  Imaging: MRI L spine 12/28/2022 IMPRESSION: 1. Mildly improved spinal stenosis at L3-4. 2. 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 3. Moderate to severe spinal and neural foraminal  stenosis at L4-5 and moderate spinal stenosis at L2-3. 4. Severe bilateral neural foraminal stenosis at L5-S1.     Electronically Signed   By: Logan Bores M.D.   On: 12/28/2022 18:01  I have personally reviewed the images and agree with the above interpretation.  Assessment and Plan: Mr. Delfavero is a pleasant 57 y.o. 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  He has multilevel lumbar spondylosis.  The worst areas of compression are L2-3, L4-5, and the neuroforamina at L5-S1.  He has mostly right-sided leg symptoms at this time.  We discussed that he has exhausted conservative management so could consider surgical intervention at this time.  We discussed that there is a smaller option which would address his loss of lumbar lordosis and would include a multilevel lumbar fusion.  I have recommended that he consider a smaller option which includes L2-3 and L4-5 decompression with a right-sided L5-S1 far  lateral foraminotomy.  He may ultimately decide to obtain additional injection.  Those have helped him in the past.  If he moves forward with surgery, he will likely try to wait until after the summer season.  He will let me know when he is ready to move forward.  Thank you for involving me in the care of this patient.      Rutha Melgoza K. Izora Ribas MD, [facility] Neurosurgery

## 2023-01-31 ENCOUNTER — Encounter: Payer: Self-pay | Admitting: Neurosurgery

## 2023-01-31 ENCOUNTER — Ambulatory Visit: Payer: BC Managed Care – PPO | Admitting: Neurosurgery

## 2023-01-31 VITALS — BP 110/77 | Ht 70.0 in | Wt 229.8 lb

## 2023-01-31 DIAGNOSIS — M5416 Radiculopathy, lumbar region: Secondary | ICD-10-CM | POA: Diagnosis not present

## 2023-01-31 DIAGNOSIS — M419 Scoliosis, unspecified: Secondary | ICD-10-CM | POA: Diagnosis not present

## 2023-06-22 ENCOUNTER — Other Ambulatory Visit: Payer: Self-pay | Admitting: Internal Medicine

## 2023-06-22 DIAGNOSIS — E7849 Other hyperlipidemia: Secondary | ICD-10-CM

## 2023-06-22 DIAGNOSIS — Z0000 Encounter for general adult medical examination without abnormal findings: Secondary | ICD-10-CM

## 2023-06-22 DIAGNOSIS — I10 Essential (primary) hypertension: Secondary | ICD-10-CM

## 2023-06-28 ENCOUNTER — Ambulatory Visit
Admission: RE | Admit: 2023-06-28 | Discharge: 2023-06-28 | Disposition: A | Discharge status: Home / Self Care | Payer: BC Managed Care – PPO | Source: Ambulatory Visit | Attending: Internal Medicine | Admitting: Internal Medicine

## 2023-06-28 DIAGNOSIS — E7849 Other hyperlipidemia: Secondary | ICD-10-CM

## 2023-06-28 DIAGNOSIS — Z0000 Encounter for general adult medical examination without abnormal findings: Secondary | ICD-10-CM

## 2023-06-28 DIAGNOSIS — I10 Essential (primary) hypertension: Secondary | ICD-10-CM

## 2023-07-10 ENCOUNTER — Encounter: Payer: Self-pay | Admitting: Internal Medicine

## 2023-07-17 ENCOUNTER — Other Ambulatory Visit (HOSPITAL_COMMUNITY): Payer: Self-pay | Admitting: Internal Medicine

## 2023-07-17 DIAGNOSIS — I2510 Atherosclerotic heart disease of native coronary artery without angina pectoris: Secondary | ICD-10-CM

## 2023-07-25 ENCOUNTER — Encounter (HOSPITAL_COMMUNITY): Payer: Self-pay

## 2023-07-25 ENCOUNTER — Other Ambulatory Visit (HOSPITAL_COMMUNITY): Payer: Self-pay | Admitting: *Deleted

## 2023-07-25 DIAGNOSIS — R072 Precordial pain: Secondary | ICD-10-CM

## 2023-07-25 MED ORDER — METOPROLOL TARTRATE 100 MG PO TABS: 100.0000 mg | ORAL_TABLET | Freq: Once | ORAL | 0 refills | Status: AC

## 2023-07-26 ENCOUNTER — Telehealth (HOSPITAL_COMMUNITY): Payer: Self-pay | Admitting: *Deleted

## 2023-07-26 ENCOUNTER — Other Ambulatory Visit (HOSPITAL_COMMUNITY): Payer: Self-pay | Admitting: *Deleted

## 2023-07-26 ENCOUNTER — Encounter (HOSPITAL_COMMUNITY): Payer: Self-pay

## 2023-07-26 NOTE — Telephone Encounter (Signed)
Reaching out to patient to offer assistance regarding upcoming cardiac imaging study; pt verbalizes understanding of appt date/time, parking situation and where to check in, pre-test NPO status and medications ordered, and verified current allergies; name and call back number provided for further questions should they arise  Larey Brick RN Navigator Cardiac Imaging [facility] [phone] office [phone] cell  Patient to take 100mg  metoprolol two hours prior to his cardiac CT scan.

## 2023-07-27 ENCOUNTER — Ambulatory Visit
Admission: RE | Admit: 2023-07-27 | Discharge: 2023-07-27 | Disposition: A | Discharge status: Home / Self Care | Payer: BC Managed Care – PPO | Source: Ambulatory Visit | Attending: Internal Medicine | Admitting: Internal Medicine

## 2023-07-27 DIAGNOSIS — I2510 Atherosclerotic heart disease of native coronary artery without angina pectoris: Secondary | ICD-10-CM | POA: Diagnosis not present

## 2023-07-27 MED ORDER — SODIUM CHLORIDE 0.9 % IV SOLN: INTRAVENOUS | Status: DC

## 2023-07-27 MED ORDER — NITROGLYCERIN 0.4 MG SL SUBL
0.8000 mg | SUBLINGUAL_TABLET | Freq: Once | SUBLINGUAL | Status: AC
Start: 2023-07-27 — End: 2023-07-27
  Administered 2023-07-27: 0.8 mg via SUBLINGUAL

## 2023-07-27 MED ORDER — IOHEXOL 350 MG/ML SOLN
100.0000 mL | Freq: Once | INTRAVENOUS | Status: AC | PRN
  Administered 2023-07-27: 100 mL via INTRAVENOUS

## 2023-07-27 NOTE — Progress Notes (Signed)
Patient tolerated CT well. Drank water after. Vital signs stable encourage to drink water throughout day.Reasons explained and verbalized understanding. Ambulated steady gait.  

## 2023-08-02 ENCOUNTER — Ambulatory Visit: Admission: RE | Admit: 2023-08-02 | Payer: BC Managed Care – PPO | Source: Ambulatory Visit

## 2024-05-30 IMAGING — CR DG CHEST 2V
1 series · 2 of 2 positions shown · non-contrast
Comparison: Chest radiograph 12/08/2019

CLINICAL DATA: Chest pain, increased blood pressure, AFib

EXAM:
CHEST - 2 VIEW

[Series 1: dg chest 2 view · 0.14mm/px · 2 of 2 slices shown]
[im 1/2]
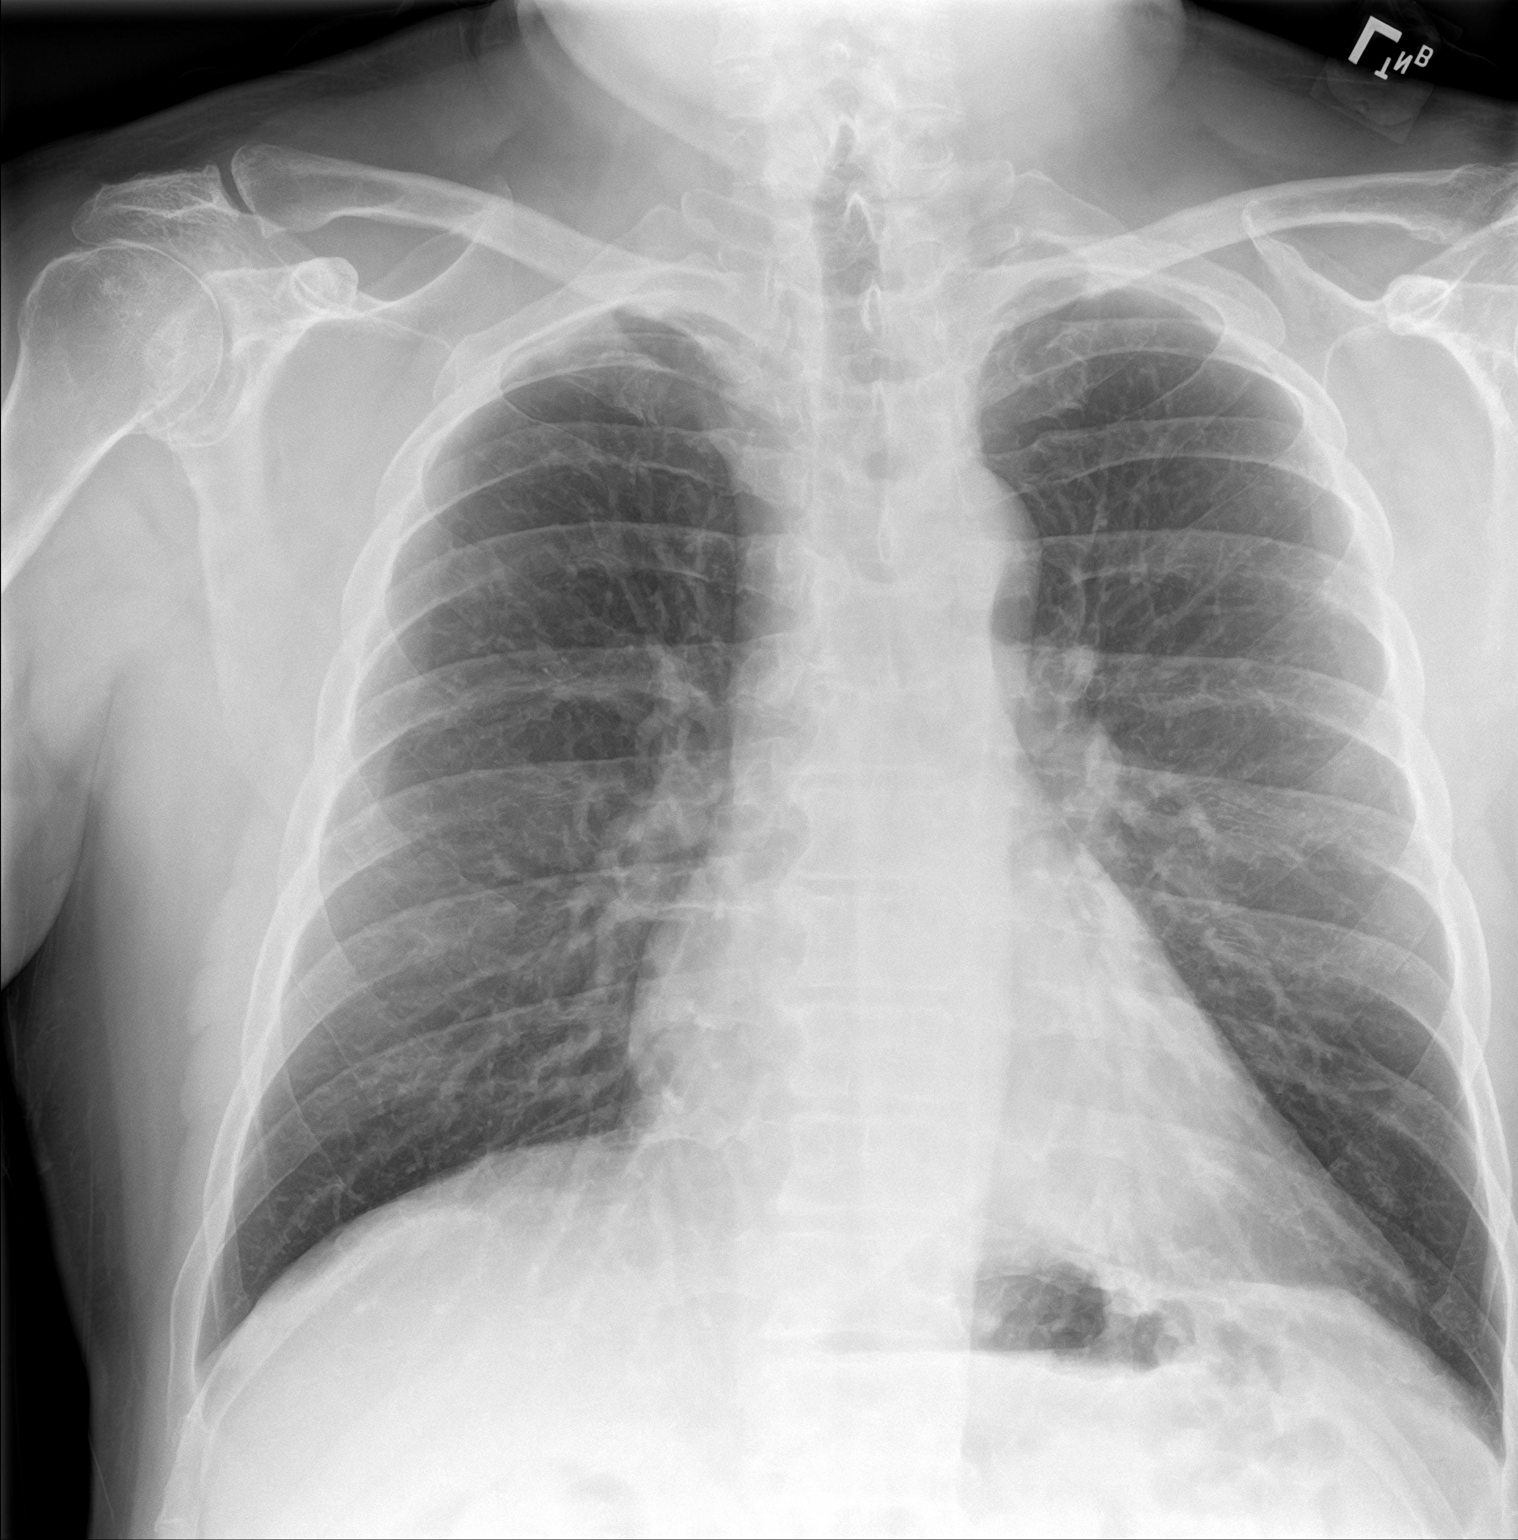
[im 2/2]
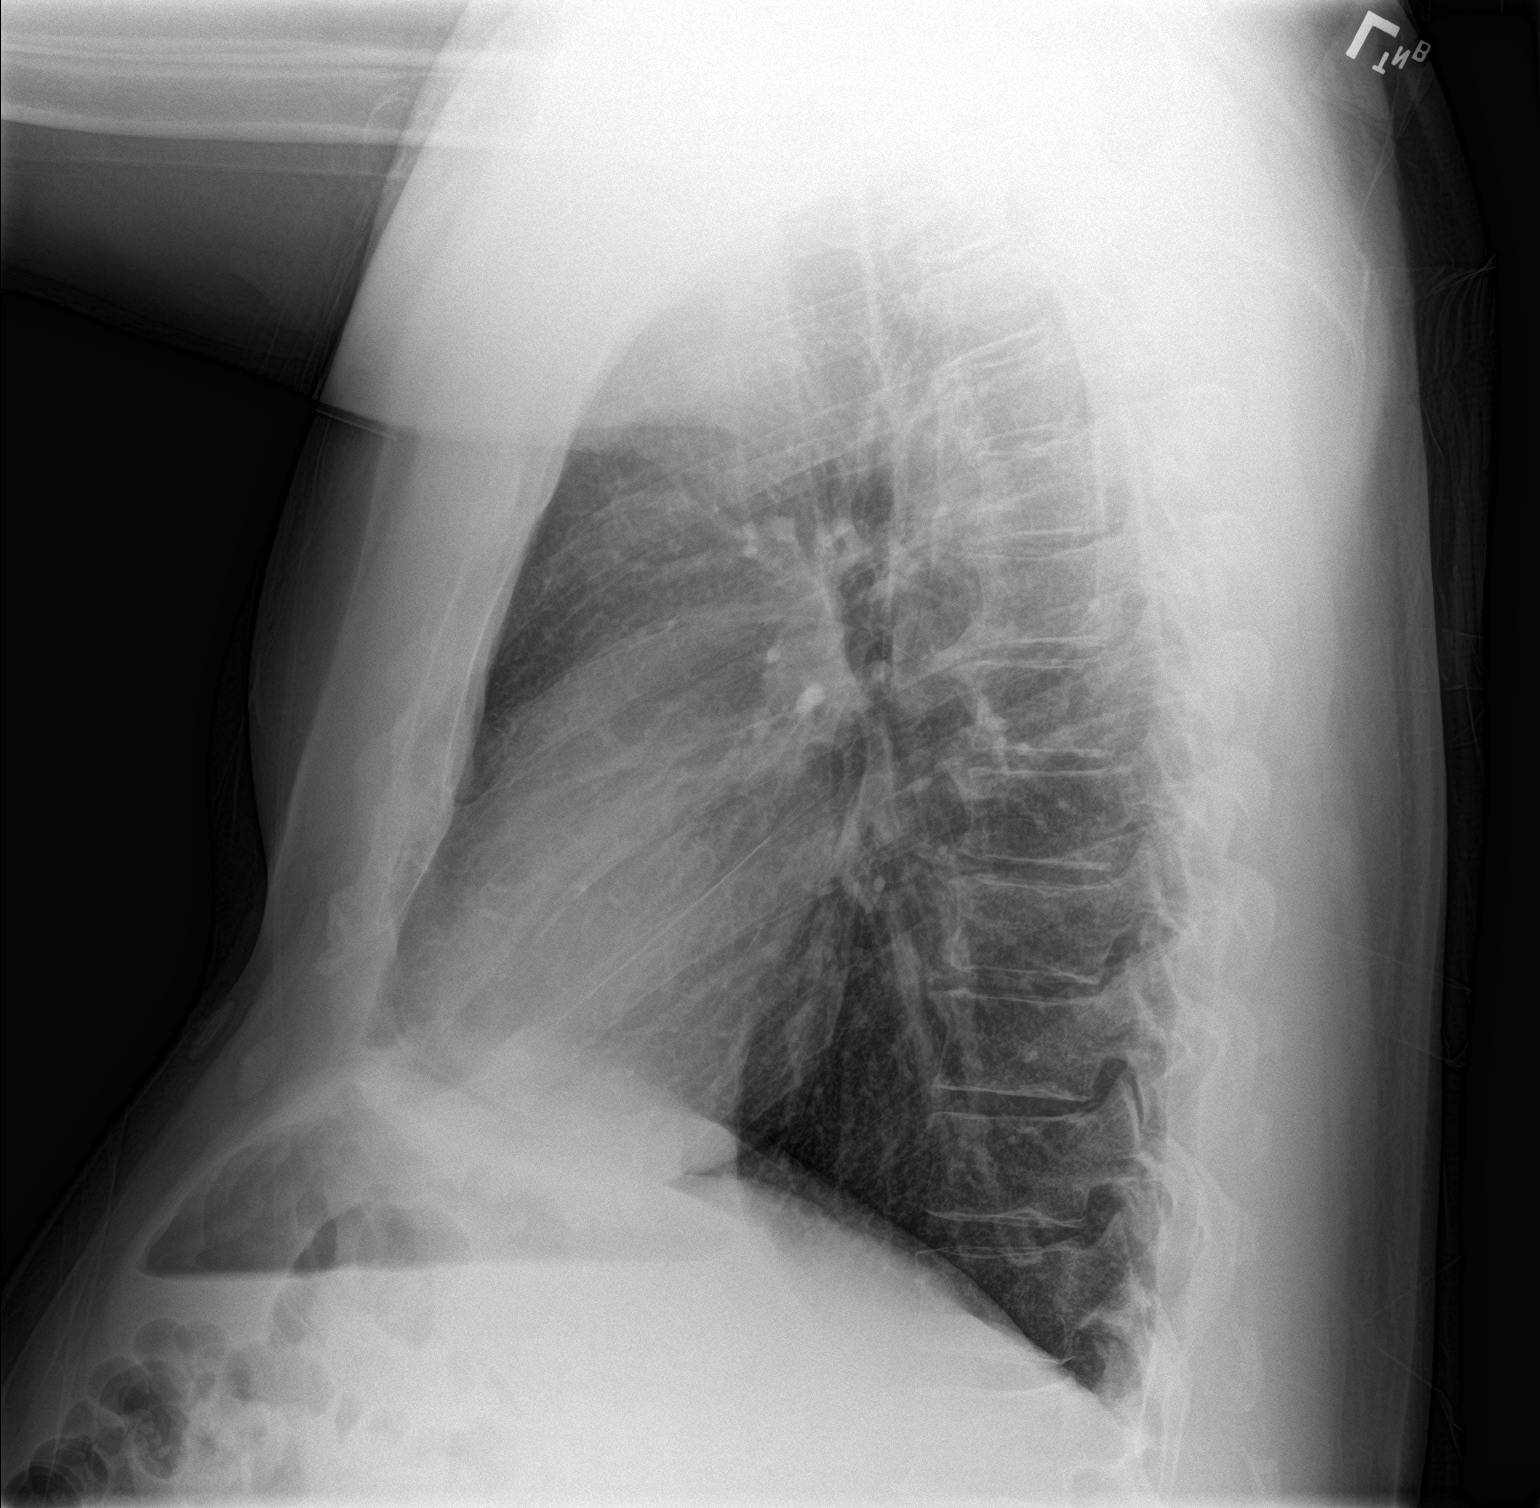

[2 of 2 positions shown; findings below may reference images not displayed]

FINDINGS: The cardiomediastinal silhouette is normal.

There is no focal consolidation or pulmonary edema. There is no
pleural effusion or pneumothorax.

There is no acute osseous abnormality.
IMPRESSION: No radiographic evidence of acute cardiopulmonary process.
